# Patient Record
Sex: Female | Born: 1970 | Race: White | Hispanic: No | Marital: Married
Health system: Midwestern US, Academic
[De-identification: ages and names within clinical notes are randomized; demographics above are authoritative.]

## PROBLEM LIST (undated history)

## (undated) DIAGNOSIS — M5417 Radiculopathy, lumbosacral region: ICD-10-CM

---

## 2016-12-18 ENCOUNTER — Encounter: Admit: 2016-12-18 | Discharge: 2016-12-18

## 2016-12-18 DIAGNOSIS — G35 Multiple sclerosis: ICD-10-CM

## 2016-12-18 DIAGNOSIS — E0500 Thyrotoxicosis with diffuse goiter without thyrotoxic crisis or storm: Principal | ICD-10-CM

## 2016-12-18 DIAGNOSIS — R531 Weakness: ICD-10-CM

## 2016-12-18 NOTE — Telephone Encounter
Patient calling inquiring about an MRI she had done recently that showed two bulging discs in her lumbar spine with foot drop, foot numbness and has been referred to a neurosurgeon for a consult. In the last 2-3 days she has began experiencing left foot and leg numbness, with progression to right foot and leg numbness as well as bowel and bladder incontinence.     Baseline MS symptoms include numbness at the bottom of her left foot and left hand.     Pain: Left sided worse, with nerve pain down the center of her left leg with numbness with numbness on the right as well. Both hands are extremely numb as well for the last 2-3 days.     Loss of bowel and bladder issues: baseline urgency issues, no bowel issues. 2/3 three mornings have woken up "soiled" and has no sensation that she has to urinate. onset 2-3 days ago.     Reports having a cavity in her teeth recently and given a prescription for amoxicillin over 1 month ago.     She reports that she may have a urinary tract infection due to change in urine odor.     Not currently on any treatment for MS. Currently enrolled in research trial and received no treatment since 2014.     Instructed patient to present to [facility] ED for evaluation for suspected MS Exacerbation.     Previously on Lemtrada in 2014, no recent doses. Last MRI completed April 2018 without any significant changes. [facility] has most recent Lumbar MRI.     Case discussed with Dr. Burnadette Peter over the phone. Report to have patient present to the ED and be admitted to Neurology with C spine MRI W/WO.

## 2016-12-19 ENCOUNTER — Inpatient Hospital Stay: Admit: 2016-12-19 | Discharge: 2016-12-19

## 2016-12-19 ENCOUNTER — Inpatient Hospital Stay
Admit: 2016-12-19 | Discharge: 2016-12-23 | Disposition: A | Discharge status: Home / Self Care | Payer: Commercial Managed Care - PPO | Attending: Neurology | Admitting: Neurology

## 2016-12-19 ENCOUNTER — Encounter: Admit: 2016-12-19 | Discharge: 2016-12-19

## 2016-12-19 LAB — CBC AND DIFF
Lab: 0.3 10*3/uL — ABNORMAL HIGH (ref 0–0.20)
Lab: 12 10*3/uL — ABNORMAL HIGH (ref 4.5–11.0)
Lab: 0.1 10*3/uL (ref 0–0.20)
Lab: 0.2 10*3/uL (ref 0–0.45)
Lab: 0.5 10*3/uL (ref 0–0.80)
Lab: 1 % (ref 0–2)
Lab: 10 K/UL (ref >60)
Lab: 2 % (ref 0–5)
Lab: 4.6 10*3/uL (ref 1.0–4.8)
Lab: 5 % (ref 4–12)
Lab: 5.1 10*3/uL (ref 1.8–7.0)

## 2016-12-19 LAB — BASIC METABOLIC PANEL
Lab: 0.7 mg/dL (ref 0.4–1.00)
Lab: 109 MMOL/L (ref 98–110)
Lab: 12 mg/dL (ref 7–25)
Lab: 139 MMOL/L — ABNORMAL LOW (ref >60)
Lab: 23 MMOL/L (ref 21–30)
Lab: 3.3 MMOL/L — ABNORMAL LOW (ref 3.5–5.1)
Lab: 7 pg (ref 3–12)
Lab: 8.5 mg/dL (ref 8.5–10.6)
Lab: 95 mg/dL (ref 70–100)
Lab: >60 mL/min (ref >60)
Lab: >60 mL/min — ABNORMAL HIGH (ref >60)

## 2016-12-19 LAB — COMPREHENSIVE METABOLIC PANEL
Lab: 0.3 mg/dL (ref 0.3–1.2)
Lab: 0.7 mg/dL (ref 0.4–1.00)
Lab: 108 MMOL/L (ref 98–110)
Lab: 11 mg/dL (ref 7–25)
Lab: 138 MMOL/L (ref 137–147)
Lab: 14 U/L (ref 7–40)
Lab: 22 MMOL/L (ref 21–30)
Lab: 3.5 MMOL/L (ref 3.5–5.1)
Lab: 4.1 g/dL (ref 3.5–5.0)
Lab: 57 U/L (ref 25–110)
Lab: 8 K/UL — ABNORMAL HIGH (ref 3–12)
Lab: 9 U/L (ref 7–56)
Lab: 9.1 mg/dL (ref 8.5–10.6)
Lab: >60 mL/min (ref >60)
Lab: >60 mL/min (ref >60)

## 2016-12-19 LAB — FREE T4-FREE THYROXINE: Lab: 1.3 ng/dL (ref 0.6–1.6)

## 2016-12-19 LAB — TSH WITH FREE T4 REFLEX: Lab: 0.1 uU/mL — ABNORMAL LOW (ref 0.35–5.00)

## 2016-12-19 LAB — MAGNESIUM: Lab: 2.3 mg/dL (ref 1.6–2.6)

## 2016-12-19 MED ORDER — GABAPENTIN 300 MG PO CAP
600 mg | Freq: Three times a day (TID) | ORAL | 0 refills | Status: DC
Start: 2016-12-19 — End: 2016-12-23
  Administered 2016-12-19 – 2016-12-23 (×14): 600 mg via ORAL

## 2016-12-19 MED ORDER — BACLOFEN 10 MG PO TAB
10 mg | Freq: Three times a day (TID) | ORAL | 0 refills | Status: DC
Start: 2016-12-19 — End: 2016-12-23
  Administered 2016-12-19 – 2016-12-23 (×14): 10 mg via ORAL

## 2016-12-19 MED ORDER — ACETAMINOPHEN 325 MG PO TAB
650 mg | ORAL | 0 refills | Status: DC | PRN
Start: 2016-12-19 — End: 2016-12-19
  Administered 2016-12-19: 08:00:00 650 mg via ORAL

## 2016-12-19 MED ORDER — GADOBENATE DIMEGLUMINE 529 MG/ML (0.1MMOL/0.2ML) IV SOLN
15 mL | Freq: Once | INTRAVENOUS | 0 refills | Status: CP
Start: 2016-12-19 — End: ?
  Administered 2016-12-19: 13:00:00 15 mL via INTRAVENOUS

## 2016-12-19 MED ORDER — HYDROCODONE-ACETAMINOPHEN 5-325 MG PO TAB
1 | Freq: Two times a day (BID) | ORAL | 0 refills | Status: DC
Start: 2016-12-19 — End: 2016-12-21
  Administered 2016-12-19 – 2016-12-21 (×5): 1 via ORAL

## 2016-12-19 MED ORDER — TRAZODONE 50 MG PO TAB
50 mg | Freq: Every evening | ORAL | 0 refills | Status: DC | PRN
Start: 2016-12-19 — End: 2016-12-23
  Administered 2016-12-20 – 2016-12-23 (×4): 50 mg via ORAL

## 2016-12-19 MED ORDER — GABAPENTIN 300 MG PO CAP
600 mg | Freq: Once | ORAL | 0 refills | Status: CP
Start: 2016-12-19 — End: ?
  Administered 2016-12-19: 12:00:00 600 mg via ORAL

## 2016-12-19 MED ORDER — LEVOTHYROXINE 125 MCG PO TAB
125 ug | Freq: Every day | ORAL | 0 refills | Status: DC
Start: 2016-12-19 — End: 2016-12-23
  Administered 2016-12-19 – 2016-12-23 (×5): 125 ug via ORAL

## 2016-12-19 MED ORDER — TOPIRAMATE 25 MG PO TAB
100 mg | Freq: Every evening | ORAL | 0 refills | Status: DC
Start: 2016-12-19 — End: 2016-12-23
  Administered 2016-12-20 – 2016-12-23 (×4): 100 mg via ORAL

## 2016-12-19 MED ORDER — ENOXAPARIN 40 MG/0.4 ML SC SYRG
40 mg | Freq: Every day | SUBCUTANEOUS | 0 refills | Status: DC
Start: 2016-12-19 — End: 2016-12-23
  Administered 2016-12-20 – 2016-12-23 (×4): 40 mg via SUBCUTANEOUS

## 2016-12-19 MED ORDER — HYDROCODONE-ACETAMINOPHEN 5-325 MG PO TAB
1 | Freq: Once | ORAL | 0 refills | Status: CP
Start: 2016-12-19 — End: ?
  Administered 2016-12-19: 12:00:00 1 via ORAL

## 2016-12-19 MED ORDER — NYSTATIN 100,000 UNIT/ML PO SUSP
500000 [IU] | Freq: Four times a day (QID) | ORAL | 0 refills | Status: DC
Start: 2016-12-19 — End: 2016-12-19

## 2016-12-19 MED ORDER — ALBUTEROL SULFATE 90 MCG/ACTUATION IN HFAA
2 | RESPIRATORY_TRACT | 0 refills | Status: DC | PRN
Start: 2016-12-19 — End: 2016-12-23

## 2016-12-19 MED ORDER — ACETAMINOPHEN 325 MG PO TAB
650 mg | ORAL | 0 refills | Status: DC | PRN
Start: 2016-12-19 — End: 2016-12-23
  Administered 2016-12-20 – 2016-12-23 (×5): 650 mg via ORAL

## 2016-12-19 MED ORDER — ENOXAPARIN 40 MG/0.4 ML SC SYRG
40 mg | Freq: Every day | SUBCUTANEOUS | 0 refills | Status: CN
Start: 2016-12-19 — End: ?

## 2016-12-19 NOTE — Case Management (ED)
Case Management Admission Assessment    NAME:Pamela Kirk                          MRN: [id number]             DOB:[birth year]          AGE: 46 y.o.  ADMISSION DATE: 12/19/2016             DAYS ADMITTED: LOS: 0 days      Today???s Date: 12/19/2016    Source of Information: pt and EMR review       Plan  Plan: CM Assessment, Assist PRN with SW/NCM Services, Discharge Planning for Home Anticipated   Pt plans to dc to home with family assistance. Pt denies the need for HH/DME/Inpt setting upon dc. Family able to provide 24/7 support upon dc.  No additional dc needs identified.  Will continue to monitor.    Patient Address/Phone  [address]  [place] [address]  [phone] (home)     Emergency Contact  Extended Emergency Contact Information  Primary Emergency Contact: Lilliane, Balay [place]  Home Phone: [phone]  Mobile Phone: [phone]  Relation: Relative  Secondary Emergency Contact: [facility]  Address: [address] [place], [place] [address] [place]  Home Phone: [phone]  Mobile Phone: [phone]  Relation: Spouse  Interpreter needed? No    Healthcare Directive         Transportation  Does the patient need discharge transport arranged?: No  Transportation Name, Phone and Availability #1: husband Riki Rusk  Does the patient use Medicaid Transportation?: No    Expected Discharge Date  Expected Discharge Date: 12/22/16  Discharge Planning Comments: home with family assistance    Living Situation Prior to Admission  ? Living Arrangements  Type of Residence: Home, independent  Living Arrangements: Spouse/significant other, Family members (husband Riki Rusk and 3 adult children (ages 42, 35, 56yrs))  Financial risk analyst / Tub: Tub/Shower Unit  How many levels in the residence?: 1  Can patient live on one level if needed?: Yes  Does residence have entry and/or side stairs?: Yes (2 steps to enter)  Assistance needed prior to admit or anticipated on discharge: No Who provides assistance or could if needed?: husband or children  Are they in good health?: Yes  Can support system provide 24/7 care if needed?: Yes  ? Level of Function   Prior level of function: Independent  ? Cognitive Abilities   Cognitive Abilities: Alert and Oriented, Engages in problem solving and planning, Participates in decision making, Recognizes impact of health condition on lifestyle    Financial Resources  ? Coverage  Primary Insurance: Theatre manager Coverage: RX    ? Source of Income   Source Of Income: Employed  ? Financial Assistance Needed?  n/a    Psychosocial Needs  ? Mental Health  Mental Health History: No  ? Substance Use History  Substance Use History Screen: No  ? Other  n/a    Current/Previous Services  ? PCP  Sidney Ace, [phone], [phone]  ? Pharmacy    [facility] [address] [place] - [address]  [address]  [place] [address]  Phone: [phone] Fax: [phone]    [facility] [phone] - [place], [place] - [address]  [address]  [place] [address]  Phone: [phone] Fax: [phone]    ? Durable Psychologist, educational at home: [employer], [employer] Chair, Single [employer]  ? Home Health  Receiving home health: No  ? Hemodialysis or Peritoneal Dialysis  Undergoing hemodialysis or peritoneal dialysis: No  ? Tube/Enteral Feeds  Receive tube/enteral feeds: No  ? Infusion  Receive infusions: No  ? Private Duty  Private duty help used: No  ? Home and Community Based Services  Home and community based services: No  ? Ryan White  Ryan White: No  ? Hospice  Hospice: No  ? Outpatient Therapy  PT: No  OT: No  SLP: No  ? Skilled Nursing Facility/Nursing Home  SNF: No  NH: No  ? Inpatient Rehab  IPR: No  ? Long-Term Acute Care Hospital  LTACH: No  ? Acute Hospital Stay  Acute Hospital Stay: No    Sela Hilding, BSN, RN  Department of Case Management  Pgr: [phone] ph: [phone]

## 2016-12-19 NOTE — ED Notes
[id number]: [id number]. Call Marcela, RN for report at [phone] please.

## 2016-12-19 NOTE — ED Notes
Pt has a hx of M.S.  Fell 3 weeks ago after a seizure , had a concussion at that time and started having back pain, the back pain has increased and pt has been incontinent of bowel and bladder. Pt placed on the monitor upon arrival,pt awake alert orient, ambulated to room 39, side rails up , bed locked in low position, call light in reach, husband at bedside, will continue to monitor

## 2016-12-19 NOTE — ED Notes
Report to Shelby RN

## 2016-12-19 NOTE — ED Notes
Clothing: shirt sweat pants bra underware tennis shoes,   Shoes:  Tennis shoes  Jewelry: none  Identification/Drivers license: none  Cash: none  Credit cards: none  Electronics:  Magazine features editor aids: partial denture  Assistive devices: none  Other:  Purse and 2 bags        Belongings disposition:  with patient at bedside

## 2016-12-19 NOTE — ED Notes
Waiting for neuro  For consult

## 2016-12-19 NOTE — Progress Notes
RESPIRATORY THERAPY  ADULT PROTOCOL EVALUATION      RESPIRATORY PROTOCOL PLAN    Medications  Albuterol: MDI PRN (home reg.)    Note: If indicated by protocol, medication orders will be placed by therapist.    Procedures          _____________________________________________________________    PATIENT EVALUATION RESULTS    Chart Review  * Pulmonary Hx: Smoking cessation < 8 weeks OR still smoking OR > 20 pack/yr hx (PEFR)_____ OR occasional use of bronchodilator (AM) (current smoker, uses albuterol as needed)    * Surgical Hx: No surgery OR last surgery > 6 weeks ago OR trach/stoma (BA)    * Chest X-Ray: Clear OR not available    * PFT/Oxygenation: FEV1, PEFR > 80% predicted OR physically unable to perform OR Pa02 >80 RA OR Sp02 >95% RA      Patient Assessment  * Respiratory Pattern: Regular pattern and rate OR good chest excursion with deep breathing    * Breath Sounds: Clear and able to auscultate bases posteriorly    * Cough / Sputum: Strong, effective cough OR nonproductive    * Mental Status: Alert, oriented, cooperative    * Activity Level: Ambulatory      Priority Index  Total Points: 2 Points  * Priority Index: 1    PRIORITY INDEX GUIDELINES*  Priority Points   1 0-9 points   2 9-18 points   3 > 18 points   + Pulm Dx or Home Rx   *Higher points indicate higher acuity.      Therapist: Loistine Chance, RT  Date: 12/19/2016      Key  [facility] = Airway clearance  AM = Aerosolized medication  BA = Bland aerosol  DB&C = Deep breathe & cough  FEV1 = Forced expiratory volume in first second)  IC = Inspiratory capacity  LE = Lung expansion  MDI = Metered dose inhaler  Neb = Nebulizer  O2 = Oxygen  Oxim =Oximetry  PEFR = Peak expiratory flow rate  RRT = Rapid Response Team

## 2016-12-19 NOTE — ED Notes
Pt updated on plan of care and resting comfortably in bed @ this time.

## 2016-12-19 NOTE — ED Notes
Report called to [facility]

## 2016-12-20 DIAGNOSIS — G35 Multiple sclerosis: Secondary | ICD-10-CM

## 2016-12-20 MED ORDER — AMITRIPTYLINE(#)/GABAPENTIN/EMU OIL 4/4/10%IN HRT TOPICAL CRM 50G
TOPICAL | 0 refills | Status: DC
Start: 2016-12-20 — End: 2016-12-23
  Administered 2016-12-21 – 2016-12-23 (×2): via TOPICAL

## 2016-12-20 MED ORDER — GADOBENATE DIMEGLUMINE 529 MG/ML (0.1MMOL/0.2ML) IV SOLN
15 mL | Freq: Once | INTRAVENOUS | 0 refills | Status: CP
Start: 2016-12-20 — End: ?
  Administered 2016-12-20: 18:00:00 15 mL via INTRAVENOUS

## 2016-12-21 LAB — VITAMIN B12: Lab: 676 pg/mL (ref 180–914)

## 2016-12-21 MED ORDER — HYDROCODONE-ACETAMINOPHEN 5-325 MG PO TAB
1.5 | Freq: Two times a day (BID) | ORAL | 0 refills | Status: DC
Start: 2016-12-21 — End: 2016-12-23
  Administered 2016-12-22 – 2016-12-23 (×4): 1.5 via ORAL

## 2016-12-21 NOTE — Consults
[name] Consult Note      Consult Date: 12/19/2016                                                  Chief Complaint/Reason for Consult:  Left leg pain/disc herniation on MRI    Assessment/Plan   Orthopedic Intervention - No acute orthopedic intervention  WB Status - WBAT BLE  ROM Status - ROMAT  Antibiotics / Tetanus - per primary, none necessary from ortho standpoint  Pain Control per primary  Diet per primary  PT/OT  DVT PPX - Mechanical, Chemoprophylaxis per primary  Dispo - would recommend anesthesia chronic pain management consult for epidural steroid injection as an inpatient.  Orthopedics will continue to follow patient after that as an outpatient.  Would like to see her in 3-4 weeks with Dr. Jean Rosenthal.  These have the patient call [phone] to schedule an appointment.    Please page Hazle Nordmann during normal business hours with any other questions or concerns.  ______________________________________________________________________    History of Present Illness: Pamela Kirk is a 46 y.o. female with a history of multiple sclerosis who was admitted to the hospital with concern for MS exacerbation.  States she has been having radicular leg pain in her left leg for the last 3-4 weeks.  Of note, immediately prior that she reports a fall while on a float trip where she landed on her back.  The pain in her leg is been getting worse over last 3 weeks.  States it begins in her buttock and goes down the posterior aspect of her leg and generally ends at her knee.  She does have some right buttock and thigh pain as well.  She also has some numbness on the dorsal aspect of her left foot.  No fevers or chills.  She has had some nighttime incontinence for a couple of weeks, but she is able to tell when she needs to go the bathroom during the day.    Past Medical History:   Diagnosis Date   ??? Graves disease    ??? Multiple sclerosis (HCC)        Past Surgical History:   Procedure Laterality Date ??? TUBAL LIGATION  2001   ??? THYROIDECTOMY  11/2012       Social History  Social History     Social History   ??? Marital status: Married     Spouse name: N/A   ??? Number of children: N/A   ??? Years of education: N/A     Occupational History   ??? Not on file.     Social History Main Topics   ??? Smoking status: Current Every Day Smoker     Packs/day: 1.00     Types: Cigarettes   ??? Smokeless tobacco: Not on file   ??? Alcohol use Yes      Comment: occ   ??? Drug use: No   ??? Sexual activity: Not on file     Other Topics Concern   ??? Not on file     Social History Narrative   ??? No narrative on file         Family history  Family history reviewed with patient, no pertinent past family history  Family history is otherwise noncontributory for presenting problem    Allergies:  Codeine; Iodine; Other [unclassified drug]; and  Latex, natural rubber    Outpatient Prescriptions as of 12/21/2016   Medication Sig Dispense Refill   ??? acetaminophen (TYLENOL) 325 mg tablet Take 2 Tabs by mouth every 6 hours as needed for Pain. (Patient taking differently: Take 650 mg by mouth every 12 hours as needed for Pain.)     ??? ALPRAZolam(+) (XANAX) 2 mg tablet Take 2 mg by mouth at bedtime daily.     ??? baclofen (LIORESAL) 10 mg tablet TAKE ONE TABLET BY MOUTH TWICE DAILY FOR MUSCLE SPASM OF  SPINAL  ORIGEN 180 tablet 3   ??? dextroamphetamine-amphetamine(+) (ADDERALL) 30 mg tablet Take 30 mg by mouth twice daily     ??? diclofenac submicronized (ZORVOLEX) 35 mg cap Take 1 capsule by mouth three times daily.     ??? gabapentin (NEURONTIN) 600 mg tablet 1 Tab four times daily. (Patient taking differently: 600 mg three times daily.) 360 Tab 1   ??? HYDROcodone/acetaminophen (NORCO) 5/325 mg tablet Take 1 tablet by mouth twice daily     ??? levothyroxine (SYNTHROID) 125 mcg tablet Take 125 mcg by mouth daily 30 minutes before breakfast.     ??? losartan(+) (COZAAR) 100 mg tablet Take 100 mg by mouth daily. ??? metoprolol XL (TOPROL XL) 25 mg extended release tablet Take 25 mg by mouth daily.     ??? prednisone (DELTASONE) 20 mg tablet Take 3 tablets by mouth daily for three days, then 2 tablets for 3 days, then 1 tablet for three days, then a 1/2 tablet for 3 days, then stop     ??? rizatriptan (MAXALT) 5 mg tablet 1 Tab as Needed. May repeat in 2 hours in needed  Indications: MIGRAINE 12 Tab 5   ??? sertraline (ZOLOFT) 100 mg tablet Take 100 mg by mouth daily.     ??? tiZANidine (ZANAFLEX) 4 mg tablet Take 4 mg by mouth three times daily as needed.     ??? topiramate (TOPAMAX) 100 mg tablet Take 100 mg by mouth daily.     ??? traZODone (DESYREL) 50 mg tablet Take 1 Tab by mouth at bedtime as needed. 90 Tab 1         Review of Systems:  Pertinent positive findings: Radicular leg pain, numbness  Pertinent negative findings: Weakness, fevers, chills, bowel or bladder incontinence  Otherwise a 10 point review of systems was negative    Vital Signs:  Last Filed in 24 hours   BP: 147/84 (08/26 1435)  Temp: 36.8 ???C (98.2 ???F) (08/26 1435)  Pulse: 60 (08/26 1435)  Respirations: 18 PER MINUTE (08/26 1435)  SpO2: 96 % (08/26 1435)  O2 Delivery: None (Room Air) (08/26 1435)     Physical Exam:    Constitutional: AAOx3, NAD  Eyes: PERRL  ENT: Oropharynx/Nasopharynx clear  Respiratory: Unlabored respirations  Cardiovascular: Regular rate  Gastrointestinal: Soft  Skin: No significant ecchymosis or erythema over her back  Vascular: Palpable DP/PT pulses bilaterally  Musculoskeletal: Active and passive range of motion of all joints is maintained.  No joints are tender to palpation.  Neurologic:   Strength  Upper Ext. Deltoid Triceps Biceps Wrist Ext Wrist Flex Grip Interossei   Right 5 5 5 5 5 5 5    Left 5 5 5 5 5 5 5    Negative Hoffman test    Lower Ext. Hip Flex Quads Hamstrings Plantarflex Dorsiflex EHL   Right 5 5 5 5 5 5    Left 5 5 5 5 5 5    Downgoing Babinki Bilaterally  Negative  Clonus Bilaterally    Reflexes: Triceps Biceps Brachiorad Patellar Achilles   Right 2+ 2+ 2+ 2+ 2+   Left 2+ 2+ 2+ 2+ 2+       Lab/Radiology/Other Diagnostic Tests:    Radiology: MRI of her lumbar spine is reviewed.  This demonstrates a relatively large paracentral disc herniation at L5-S1 with significant compression of the S1 nerve root on the left    CBC w/Diff   Lab Results   Component Value Date/Time    WBC 10.4 12/19/2016 11:32 AM    HGB 12.3 12/19/2016 11:32 AM    HCT 37.5 12/19/2016 11:32 AM    PLTCT 273 12/19/2016 11:32 AM           Basic Metabolic Profile   Lab Results   Component Value Date/Time    NA 139 12/19/2016 11:32 AM    K 3.3 (L) 12/19/2016 11:32 AM    CL 109 12/19/2016 11:32 AM    CO2 23 12/19/2016 11:32 AM    GAP 7 12/19/2016 11:32 AM    BUN 12 12/19/2016 11:32 AM    CR 0.78 12/19/2016 11:32 AM    GLU 95 12/19/2016 11:32 AM        Coagulation Studies   No results found for: PT, PTT, INR         Frances Maywood, MD

## 2016-12-22 LAB — HEMOGLOBIN A1C: Lab: 5.3 % (ref 4.0–6.0)

## 2016-12-22 NOTE — Progress Notes
RESPIRATORY THERAPY  ADULT PROTOCOL EVALUATION      RESPIRATORY PROTOCOL PLAN    Medications  Albuterol: MDI PRN    Note: If indicated by protocol, medication orders will be placed by therapist.    Procedures          _____________________________________________________________    PATIENT EVALUATION RESULTS    Chart Review  * Pulmonary Hx: Smoking cessation < 8 weeks OR still smoking OR > 20 pack/yr hx (PEFR)_____ OR occasional use of bronchodilator (AM)    * Surgical Hx: No surgery OR last surgery > 6 weeks ago OR trach/stoma (BA)    * Chest X-Ray: Clear OR not available    * PFT/Oxygenation: FEV1, PEFR > 80% predicted OR physically unable to perform OR Pa02 >80 RA OR Sp02 >95% RA      Patient Assessment  * Respiratory Pattern: Regular pattern and rate OR good chest excursion with deep breathing    * Breath Sounds: Clear and able to auscultate bases posteriorly    * Cough / Sputum: Strong, effective cough OR nonproductive    * Mental Status: Alert, oriented, cooperative    * Activity Level: Ambulatory with assistance      Priority Index  Total Points: 3 Points  * Priority Index: 1+    PRIORITY INDEX GUIDELINES*  Priority Points   1 0-9 points   2 9-18 points   3 > 18 points   + Pulm Dx or Home Rx   *Higher points indicate higher acuity.      Therapist: Priscille Loveless, RT  Date: 12/22/2016      Key  AC = Airway clearance  AM = Aerosolized medication  BA = Bland aerosol  DB&C = Deep breathe & cough  FEV1 = Forced expiratory volume in first second)  IC = Inspiratory capacity  LE = Lung expansion  MDI = Metered dose inhaler  Neb = Nebulizer  O2 = Oxygen  Oxim =Oximetry  PEFR = Peak expiratory flow rate  RRT = Rapid Response Team

## 2016-12-22 NOTE — Progress Notes
PHYSICAL THERAPY  ASSESSMENT/ DISCHARGE     MOBILITY:  Mobility  Progressive Mobility Level: Walk in hallway  Distance Walked (feet): 150 ft  Level of Assistance: Stand by assistance  Assistive Device: None  Time Tolerated: 11-30 minutes  Activity Limited By: Pain;Weakness    SUBJECTIVE:  Subjective  Significant hospital events: history of multiple sclerosis who was admitted to the hospital with concern for MS exacerbation. 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 and compression of L S1 nerve  Mental / Cognitive Status: Alert;Oriented;Cooperative  Persons Present: Spouse;Occupational Therapist  Pain: Patient complains of pain;Patient does not rate pain  Pain Location: Back  Pain Description: Aching  Pain Interventions: Patient agrees to participate in therapy  Precautions: Back Safety  Ambulation Assist: Independent Mobility in Community without Device  Patient Owned Equipment: Single Point [employer]  Home Situation: Lives with Family  Type of Home: House  Entry Stairs: No Rail;1-2 Stairs  In-Home Stairs: No Stairs    ROM:  ROM  UE ROM WFL: Yes  LE ROM WFL: Yes    STRENGTH:  Strength  Overall Strength: WFL  Strength Comment: pain with MMT to LLE    POSTURE/NEURO:  Posture / Neurological  LLE Sensation/Proprioception: Impaired Light Touch  RLE Sensation/Proprioception: Intact Light Touch  Coordination: No Deficits Noted    BED MOBILITY/TRANSFERS:  Bed Mobility/Transfers  Bed Mobility: Rolling: Independent  Bed Mobility: Supine to Sit: Modified Independent  Bed Mobility: Sit to Supine: Modified Independent  Comments: demonstrates ind log roll tech  Transfer Type: Sit to/from Stand  Transfer: Assistance Level: To/From;Bed;Modified Independent  Transfer: Assistive Device: None  End Of Activity Status: In Bed;Instructed Patient to Request Assist with Mobility;Instructed Patient to Use Call Light    BALANCE:  Balance Sitting Balance: Static Sitting Balance;Dynamic Sitting Balance;No UE Support;Independent  Standing Balance: Static Standing Balance;Dynamic Standing Balance;No UE support;Standby Assist  Comments: patient ed to take advantage of cane and 4wh rolling walker at home for balance and pain relief in acute stages    GAIT:  Gait  Gait Distance: 150 feet  Gait: Assistance Level: Standby Assist  Gait: Assistive Device: None  Gait: Descriptors: Antalgic;Decreased stance time LLE;Pace: Slow;No balance loss;Decreased step length  Activity Limited By: Complaint of Pain  Comments: ed verbally on step to pattern to navigate steps of her home, verbalized understanding    EDUCATION:  Education  Persons Educated: Patient/Family  Patient Barriers To Learning: None Noted  Interventions: Repetition of Instructions;Family Education  Teaching Methods: Verbal Instruction  Patient Response: Verbalized Understanding  Topics: Plan/Goals of PT Interventions;Use of Assistive Device/Orthosis;Mobility Progression;Importance of Increasing Activity;Ambulate with Family;Recommend Continued Therapy    ASSESSMENT/PROGRESS:  Assessment/Progress  Impaired Mobility Due To: Pain  Impaired Strength Due To: Pain  Assessment/Progress: Should Improve w/ Continued PT  AM-PAC 6 Clicks Basic Mobility Inpatient  Turning from your back to your side while in a flat bed without using bed rails: None  Moving from lying on your back to sitting on the side of a flatbed without using bedrails : None  Moving to and from a bed to a chair (including a wheelchair): None  Standing up from a chair using your arms (e.g. wheelchair, or bedside chair): None  To walk in hospital room: None  Climbing 3-5 steps with a railing: A Little  Raw Score: 23  Standardized (T-scale) Score: 50.88  Basic Mobility CMS 0-100%: 16.55  CMS G Code Modifier for Basic Mobility: CI  GOALS:  Goals  Goal Formulation: With Patient    PLAN:  Plan Treatment Interventions: Mobility Training;Strengthening;ROM;Neuromuscular Reeducation;Balance Activities  Plan Frequency:  (rec outpatient therapy)    RECOMMENDATIONS:  PT Discharge Recommendations  PT Discharge Recommendations: Home with Assistance;Outpatient Therapy Setting  Equipment Recommendations: Patient owns necessary equipment  Comments: patient safe to d/c home with family and outpatient therapy    Therapist: Dalphine Handing, PT, DPT, [facility]  Date: 12/22/2016

## 2016-12-22 NOTE — Progress Notes
I have reviewed the notes, assessment, and/or procedures performed by Michelle O. and concur with her documentation unless otherwise noted.

## 2016-12-22 NOTE — Consults
Chronic Pain Consult Note      Admission Date: 12/19/2016                                                LOS: 3 days    Reason for Consult:  Pain intervention/injection    Consult type: Opinion with orders    Assessment/Plan   Matasha L Blankinship is a 46 y.o. female with h/o multiple sclerosis admitted with leg pain in radicular pattern  - MRI L-spine with moderate left subarticular disc herniation at L5-S1 with severe left lateral recess stenosis and compression of the descending left S1 nerve root  - Radicular symptoms consistent with imaging above  - Will plan for bilateral L5-S1 transforaminal epidural steroid injections tomorrow  - Prophylactic lovenox will need to be held for 12 hours but she will not need to be NPO    Joelyn Oms, MD  Pager [phone] 8a-4p M-F ([phone] for urgent issues after hours and on weekends)  ______________________________________________________________________    History of Present Illness: Ellorie L Mcgrue is a 46 y.o. female is admitted with leg pain.  She localizes the pain to her left leg. She says it started about 3-4 weeks ago when she had a fall while she was on a float trip where she landed on her back.  The leg pain is gotten progressively worse since that time. The pain radiates from her back to her buttock and down the posterior aspect of her leg to the level of her knee. She has some numbness in the top of her left foot. She has some right leg pain but worse on the left.     Past Medical History:   Diagnosis Date   ??? Graves disease    ??? Multiple sclerosis (HCC)      Past Surgical History:   Procedure Laterality Date   ??? TUBAL LIGATION  2001   ??? THYROIDECTOMY  11/2012     Social History     Social History   ??? Marital status: Married     Spouse name: N/A   ??? Number of children: N/A   ??? Years of education: N/A     Social History Main Topics   ??? Smoking status: Current Every Day Smoker     Packs/day: 1.00     Types: Cigarettes   ??? Smokeless tobacco: Not on file   ??? Alcohol use Yes Comment: occ   ??? Drug use: No   ??? Sexual activity: Not on file     Other Topics Concern   ??? Not on file     Social History Narrative   ??? No narrative on file     No family history on file.  Allergies:  Codeine; Iodine; Other [unclassified drug]; and Latex, natural rubber    Scheduled Meds:  amitriptyline/gabapentin/emu oil(#) 07/30/08 % topical cream  Topical Q8H   baclofen (LIORESAL) tablet 10 mg 10 mg Oral TID   enoxaparin (LOVENOX) syringe 40 mg 40 mg Subcutaneous QDAY(21)   gabapentin (NEURONTIN) capsule 600 mg 600 mg Oral TID   HYDROcodone/acetaminophen (NORCO) 5/325 mg tablet 1.5 tablet 1.5 tablet Oral BID   levothyroxine (SYNTHROID) tablet 125 mcg 125 mcg Oral QDAY   topiramate (TOPAMAX) tablet 100 mg 100 mg Oral QHS   Continuous Infusions:  PRN and Respiratory Meds:acetaminophen Q6H PRN, albuterol Q4H PRN, traZODone QHS PRN  Review of Systems:  A 14 point review of systems was negative except for: back and leg pain  Vital Signs:  Last Filed in 24 hours Vital Signs:  24 hour Range    BP: 147/93 (08/27 1400)  Temp: 36.8 ???C (98.3 ???F) (08/27 1400)  Pulse: 62 (08/27 1400)  Respirations: 17 PER MINUTE (08/27 1400)  SpO2: 99 % (08/27 1400)  O2 Delivery: None (Room Air) (08/27 1400) BP: (145-156)/(75-93)   Temp:  [36.6 ???C (97.8 ???F)-37.2 ???C (98.9 ???F)]   Pulse:  [59-68]   Respirations:  [16 PER MINUTE-18 PER MINUTE]   SpO2:  [95 %-99 %]   O2 Delivery: None (Room Air)     Physical Exam:  General: Alert, cooperative, no distress  Head: Normocephalic, atraumatic  Eyes: Conjunctivae/corneas clear  Lungs: Unlabored respirations  Heart: Normal rate by palpation of pulse  Abdomen: Non-distended  Skin: Warm and dry to touch  Psychiatric: Mood and affect normal  Musculoskeletal: Mod lumbar tenderness. Increased pain with lumbar extension.  Neurologic: Strength 4+/5 LLE, 5/5 RLE. Sensation to light touch is decreased LLE. Straight leg raise is positive on the left.      Lab/Radiology/Other Diagnostic Tests: 24-hour labs:  No results found for this visit on 12/19/16 (from the past 24 hour(s)).  Pertinent radiology reviewed.    Joelyn Oms, MD  Pager [phone]

## 2016-12-22 NOTE — Progress Notes
Notified by team that pt doesn't currently need IV as long as no further scans are needed. Team consulted anesthesia/pain for possibility of steroid shots instead. RN acknowledged and will continue to monitor

## 2016-12-22 NOTE — Progress Notes
OCCUPATIONAL THERAPY  ASSESSMENT NOTE    Patient Name: Pamela Kirk                   Room/Bed: ZO1096/04  Admitting Diagnosis: MS (multiple sclerosis) (HCC)  Past Medical History:   Diagnosis Date   ??? Graves disease    ??? Multiple sclerosis (HCC)        Mobility  Progressive Mobility Level: Walk in hallway  Distance Walked (feet): 200 ft  Level of Assistance: Stand by assistance  Assistive Device: None  Time Tolerated: 11-30 minutes  Activity Limited By: Pain    Subjective  Pertinent Dx per Physician: history of multiple sclerosis who was admitted to the hospital with concern for MS exacerbation. 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 and compression of L S1 nerve  Precautions: Standard  Pain Location: Back  Pain Level Current: 5  Comments: Pt with c/o headache and back pain. Pt states she does not have any concerns regarding discharge home.    Objective  Psychosocial Status: Willing and Cooperative to Participate  Persons Present: Spouse;Physical Therapist    Home Living  Type of Home: House  Home Layout: One Level;Stairs to [employer] w/o [employer] (2 steps to enter)  Financial risk analyst / Tub: Tub/Shower Unit  [employer] Toilet: Standard  Bathroom Equipment: Buyer, retail: Administrator, arts  Comment: Spouse reports he plans to purchase a toilet riser    Prior Function  Level Of Independence: Independent with ADLs and functional transfers;Independent with homemaking w/ ambulation  Lives With: Spouse  Receives Help From: None Needed  Homemaking Tasks: Meal Prep;Laundry;Cleaning  Other Function Comments: Pt reports she is independent. Owns equipment however does not use it regularly. Pt denies concerns regarding discharge home.    Vision  Current Vision: No Visual Deficits;Does Not Wear Glasses  Comment: Pt denies vision changes    ADL's  Where Assessed: In Bathroom;Standing at [employer]: Independent Grooming Deficits: No Assist Needed;Wash/Dry Hands  LE Dressing Assist: Stand By Assist  LE Dressing Deficits: Don/Doff R Sock;Don/Doff L Sock  Toileting Assist: Stand By Assist  Toileting Deficits: Supervision/Safety  Functional Transfer Assist: Stand By Assist  Functional Transfer Deficits: Supervision/Safety  Comment: Pt reports no concerns regarding discharge home. Pt transfers and ambulates with stand by assistance.    Activity Tolerance  Endurance: 3/5 Tolerates 25-30 Minutes Exercise w/Multiple Rests  Sitting Balance: 5/5 Moves/Returns Trunkal Midpoint in All Planes > 2 Inches    Cognition  Overall Cognitive Status: WFL to Adequately Complete Self Care Tasks Safely  Orientation: Alert & Oriented x4  Attention: Awake/Alert    UE PROM  Overall BUE PROM WNL: Yes    UE AROM  Overall BUE AROM WNL: Yes  Coordination: Adequate to Complete ADLs  Grasp: Bilateral Grasp Functional for Activity    UE Strength / Tone  Overall Strength / Tone: WFL Able to Perform ADL Tasks    Education  Persons Educated: Patient  Barriers To Learning: None Noted  Teaching Methods: Verbal Instruction  Patient Response: Verbalized Understanding  Topics: Role of OT, Goals for Therapy;Home safety  Goal Formulation: With Patient    Assessment  Assessment: Decreased ADL Status;Decreased Self-Care Trans;Decreased High-Level ADLs;Decreased Endurance  Prognosis: Good;Ongoing OT Assessment Needed  Goal Formulation: Patient  No Skilled OT: Safe To Return Home    AM-PAC 6 Clicks Daily Activity Inpatient  Putting on and taking off regular lower body clothes?: None  Bathing (  Including washing, rinsing, drying): None  Toileting, which includes using toilet, bedpan, or urinal: None  Putting on and taking off regular upper body clothing: None  Taking care of personal grooming such as brushing teeth: None  Eating meals?: None  Daily Activity Raw Score: 24  Standardized (t-scale) score: 57.54  CMS 0-100% Score: 0  CMS G Code Modifier: CH    Plan Progress: Discontinue OT  OT Frequency: No Further Treatment    OT Discharge Recommendations  OT Discharge Recommendations: Home with family assist, Home setting w/Outpatient Therapy  Equipment Recommendations: Patient owns necessary equipment      Therapist: Roxy Horseman, OTR/L [id number]  Date: 12/22/2016

## 2016-12-23 ENCOUNTER — Inpatient Hospital Stay: Admit: 2016-12-20 | Discharge: 2016-12-20

## 2016-12-23 ENCOUNTER — Inpatient Hospital Stay: Admit: 2016-12-23 | Discharge: 2016-12-23

## 2016-12-23 DIAGNOSIS — M48061 Spinal stenosis, lumbar region without neurogenic claudication: ICD-10-CM

## 2016-12-23 DIAGNOSIS — M5117 Intervertebral disc disorders with radiculopathy, lumbosacral region: Principal | ICD-10-CM

## 2016-12-23 DIAGNOSIS — G43909 Migraine, unspecified, not intractable, without status migrainosus: ICD-10-CM

## 2016-12-23 DIAGNOSIS — I10 Essential (primary) hypertension: ICD-10-CM

## 2016-12-23 DIAGNOSIS — F17210 Nicotine dependence, cigarettes, uncomplicated: ICD-10-CM

## 2016-12-23 DIAGNOSIS — F329 Major depressive disorder, single episode, unspecified: ICD-10-CM

## 2016-12-23 DIAGNOSIS — R32 Unspecified urinary incontinence: ICD-10-CM

## 2016-12-23 DIAGNOSIS — G35 Multiple sclerosis: ICD-10-CM

## 2016-12-23 DIAGNOSIS — E890 Postprocedural hypothyroidism: ICD-10-CM

## 2016-12-23 DIAGNOSIS — F419 Anxiety disorder, unspecified: ICD-10-CM

## 2016-12-23 MED ORDER — ACETAMINOPHEN 325 MG PO TAB
650 mg | Freq: Two times a day (BID) | ORAL | 0 refills | Status: SS | PRN
Start: 2016-12-23 — End: 2017-05-06

## 2016-12-23 MED ORDER — GABAPENTIN 600 MG PO TAB
600 mg | Freq: Three times a day (TID) | ORAL | 0 refills | Status: AC
Start: 2016-12-23 — End: 2017-04-23

## 2016-12-23 MED ORDER — AMITRIPTYLINE(#)/GABAPENTIN/EMU OIL 4/4/10%IN HRT TOPICAL CRM 50G
Freq: Two times a day (BID) | 1 refills | 30.00000 days | Status: AC | PRN
Start: 2016-12-23 — End: 2017-04-23

## 2016-12-23 MED ORDER — METHYLPREDNISOLONE ACETATE 80 MG/ML IJ SUSP
80 mg | Freq: Once | INTRAMUSCULAR | 0 refills | Status: DC
Start: 2016-12-23 — End: 2016-12-23
  Administered 2016-12-23: 17:00:00 80 mg via INTRAMUSCULAR

## 2016-12-23 MED ORDER — AMITRIPTYLINE(#)/GABAPENTIN/EMU OIL 4/4/10%IN HRT TOPICAL CRM 50G
1 refills | 30.00000 days | Status: AC
Start: 2016-12-23 — End: 2017-04-23

## 2016-12-23 NOTE — Progress Notes
Neurology Progress Note  Pamela Kirk  Admission Date: 12/19/2016  LOS: 4 days     Assessment/Plan:  Active Problems:    MS (multiple sclerosis) (HCC)    Pamela Kirk is a 46 y/o female with history of MS, irregular heart rate, HTN, Grave's disease s/p resection, anxiety, depression, migraines, neuropathy that presented with left sided weakness, numbness/tingling over last 3 weeks and new onset incontinence over last 3 days concerning for MS exacerbation.     Left Sided Weakness, numbness/tingling  Incontinence  -MRI L spine 8/24: L subarticular disc herniation at L5-S1 with severe L lateral recess stenosis and compression of L S1 nerve.  Non-enhancing T2 hyperintense lesion of T11-T12 consistent with prior MS plaque. Mild central spinal stenosis at L4-L5 and neural foraminal stenosis at L4-L5 and L5-S1  -MRI C/T spine w/ and w/out: no new enhancing lesions  -Ortho Consult: No intervention necessary on bulging disc in lumbar spine, recommend pain consult for steroid injection  -Reported improvement in incontinence issues since being admitted    >Epidural steroid injection today, medically stable for discharge home following the procedure    >Continue baclofen 10mg  TID   >Continue Gabapentin 600mg  TID   >Continue Norco BID   >Trazodone prn insomniaa   >Will provide referral for outpatient PT/OT on discharge     R sided back and hip pain   > Tylenol, baclofen, gabapentin, and norco all available   > Added topical amitriptyline/emu oil/gabapentin cream    Migraines   >Continue pta Topamax    Hypothyroidism, Graves   >Continue pta synthroid    Renal/GU: Complaints of incontinence.    -BUN/CR unremarkable at 12/0.78   >Monitor I/Os    FEN/GI: Regular diet. Monitor I/Os.      Code: Full   PPX: Lovenox, SCDS    The patient was seen and discussed with Dr. Debroah Loop       _____________________________________________________________________  Subjective: Pamela Kirk is a 46 y.o. female.  No new events noted. Patient reports continued back pains. Discussed MRI results in detail. Plan of care discussed.     Objective:                    Vital Signs:  Last Filed                   Vital Signs: 24 Hour Range   BP: 143/91 (08/28 0604)  Temp: 36.9 ???C (98.4 ???F) (08/28 0604)  Pulse: 69 (08/28 0604)  Respirations: 14 PER MINUTE (08/28 0604)  SpO2: 94 % (08/28 0604)  O2 Delivery: None (Room Air) (08/28 0604)  BP: (143-156)/(89-98)   Temp:  [36.7 ???C (98.1 ???F)-36.9 ???C (98.5 ???F)]   Pulse:  [57-69]   Respirations:  [14 PER MINUTE-17 PER MINUTE]   SpO2:  [94 %-99 %]   O2 Delivery: None (Room Air)    Intensity Pain Scale 0-10 (Pain 1): (not recorded)    Intake/Output Summary: (Last 24 hours)    Intake/Output Summary (Last 24 hours) at 12/23/16 0709  Last data filed at 12/22/16 1809   Gross per 24 hour   Intake             1330 ml   Output                0 ml   Net             1330 ml      Stool Occurrence: 0  Medications:  Scheduled  Meds:    amitriptyline/gabapentin/emu oil(#) 07/30/08 % topical cream  Topical Q8H   baclofen (LIORESAL) tablet 10 mg 10 mg Oral TID   enoxaparin (LOVENOX) syringe 40 mg 40 mg Subcutaneous QDAY(21)   gabapentin (NEURONTIN) capsule 600 mg 600 mg Oral TID   HYDROcodone/acetaminophen (NORCO) 5/325 mg tablet 1.5 tablet 1.5 tablet Oral BID   levothyroxine (SYNTHROID) tablet 125 mcg 125 mcg Oral QDAY   topiramate (TOPAMAX) tablet 100 mg 100 mg Oral QHS   Continuous Infusions:  PRN and Respiratory Meds:acetaminophen Q6H PRN, albuterol Q4H PRN, traZODone QHS PRN     General physical exam:    HEENT: normocephalic    Neuro exam:   Mental status: alert, oriented to person/place/time  Speech:    Normal Abnormal   Fluency x    Comprehension x    Articulation x                  Cranial Nerves:    Normal Abnormal   II PERRL    III, IV, VI EOMI, no nystagmus    V Sensation nml V1-V3    VII            Symmetric smile      VIII nml to finger rub    IX, X Palate Even XI Equal shoulder shrug    XII Tongue midline        Muscle/motor:   Tone: WNL  Bulk: WNL     NF NE SA EF EE WE WF FF FE FA TA HF HA HE KF KE DF PF In Ev TF TE   R   5 5 5 5 5 5    5   5 5 5 5        L   5 5 5 5 5 5    5   5 5 5 5            Sensation:    Normal RUE LUE RLE LLE   Light Touch x       Pin Prick        Temperature        Vibration        Proprioception            Coordination:    Normal Abnormal Right Abnormal Left   Finger to Nose x     Rapid alternating       Heel to [employer] tap      Foot tap      Other        Gait and Sation:  Regular gait: Deferred    Reflexes:    Right Left   Triceps 2 2   Biceps 2 2   Brachioradialis 3 3   Patella 3 3               Laboratory Review:     No results found for: PHART, PCO2A, PO2ART, HCO3A, BASEEXA, BASEDEFA, O2SATACAL  Lab Results   Component Value Date    HGB 12.3 12/19/2016    HCT 37.5 12/19/2016    PLTCT 273 12/19/2016    WBC 10.4 12/19/2016    NEUT 47 12/19/2016    ANC 5.10 12/19/2016     Lab Results   Component Value Date    NA 139 12/19/2016    K 3.3 (L) 12/19/2016    CL 109 12/19/2016    CO2 23 12/19/2016    GAP 7 12/19/2016    BUN  12 12/19/2016    CR 0.78 12/19/2016    GLU 95 12/19/2016    CA 8.5 12/19/2016    MG 2.3 12/19/2016    TOTPROT 6.9 12/19/2016    AST 14 12/19/2016    ALT 9 12/19/2016    ALKPHOS 57 12/19/2016     No results found for: TNI, CKMB, MYOGLB  No results found for: PTT, INR, FIB, DIMER, FDP  Lab Results   Component Value Date    TSH 0.150 (L) 12/19/2016    FREET4R 1.3 12/19/2016     No results found for: CHOL, TRIG, HDL, LDL, VLDL  No results found for: VANRAN, VANPK, VANTR  No results found for: CYCLOSPOR, TACROLIMUS, FREEPHENY     Point of Care Testing:  (Last 24 hours):  Glucose: 95    Radiology and Other Diagnostics Review: Reviewed     Barb Merino  PGY-2  [phone]

## 2016-12-23 NOTE — Progress Notes
[facility]  INTERVENTIONAL PAIN PROCEDURE HISTORY AND PHYSICAL    No chief complaint on file.      HISTORY OF PRESENT ILLNESS:    Pamela Kirk is a 46 y.o. female is admitted with leg pain.  She localizes the pain to her left leg. She says it started about 3-4 weeks ago when she had a fall while she was on a float trip where she landed on her back.  The leg pain is gotten progressively worse since that time. The pain radiates from her back to her buttock and down the posterior aspect of her leg to the level of her knee. She has some numbness in the top of her left foot. She has some right leg pain but worse on the left.     Past Medical History:   Diagnosis Date   ??? Graves disease    ??? Multiple sclerosis (HCC)        Past Surgical History:   Procedure Laterality Date   ??? TUBAL LIGATION  2001   ??? THYROIDECTOMY  11/2012       family history is not on file.    Social History     Social History   ??? Marital status: Married     Spouse name: N/A   ??? Number of children: N/A   ??? Years of education: N/A     Occupational History   ??? Not on file.     Social History Main Topics   ??? Smoking status: Current Every Day Smoker     Packs/day: 1.00     Types: Cigarettes   ??? Smokeless tobacco: Not on file   ??? Alcohol use Yes      Comment: occ   ??? Drug use: No   ??? Sexual activity: Not on file     Other Topics Concern   ??? Not on file     Social History Narrative   ??? No narrative on file       Allergies   Allergen Reactions   ??? Codeine HIVES   ??? Iodine RASH   ??? Other [Unclassified Drug] HIVES     Hair coloring agent, unknown which agent   ??? Latex, Natural Rubber RASH       Vitals:    12/23/16 1033   Weight: 74.8 kg (165 lb)   Height: 162.6 cm (64)       REVIEW OF SYSTEMS: 10 point ROS obtained and negative except per HPI      PHYSICAL EXAM:  Gen: Alert x 3  Chest: CTAB  Neck:Supple  Psych: Normal mood and affect  Skin: no rashes or lesions  Neuro: Grossly intact  Musc:     Tenderness in L-spine        IMPRESSION: 1. Lumbosacral radiculopathy due to intervertebral disc disorder         PLAN: Lumbar Transforaminal Steroid Injection

## 2016-12-23 NOTE — Procedures
Attending Surgeon: Bella Kennedy, MD    Anesthesia: Local    Pre-Procedure Diagnosis:   1. Lumbosacral radiculopathy due to intervertebral disc disorder        Post-Procedure Diagnosis:   1. Lumbosacral radiculopathy due to intervertebral disc disorder        SNR/TF LMBR/SAC  Procedure: transforaminal epidural    Laterality: left  Location: sacral - S1-2      Consent:   Consent obtained: verbal and written  Consent given by: patient  Risks discussed: allergic reaction, bleeding, bruising, infection, nerve damage, no change or worsening in pain, weakness, swelling and reaction to medication  Alternatives discussed: alternative treatment and delayed treatment  Discussed with patient the purpose of the treatment/procedure, other ways of treating my condition, including no treatment/ procedure and the risks and benefits of the alternatives. Patient has decided to proceed with treatment/procedure.        Universal Protocol:  Relevant documents: relevant documents present and verified  Test results: test results available and properly labeled  Imaging studies: imaging studies available  Required items: required blood products, implants, devices, and special equipment available  Site marked: the operative site was marked  Patient identity confirmed: Patient identify confirmed verbally with patient.      Time out: Immediately prior to procedure a time out was called to verify the correct patient, procedure, equipment, support staff and site/side marked as required      Procedures Details:   Indications: pain   Prep: Betadine  Patient position: prone  Estimated Blood Loss: minimal  Specimens: none  Amount Injected:   S1-2: 2mL    Number of Levels: 1  Approach: left paramedian  Guidance: fluoroscopy  Contrast: Procedure confirmed with contrast under live fluoroscopy.  Needle size: 25 G  Injection procedure: Incremental injection and Negative aspiration for blood Patient tolerance: Patient tolerated the procedure well with no immediate complications. Pressure was applied, and hemostasis was accomplished.  Outcome: Pain unchanged  Comments: 40mg  of depomedrol and 1ml of normal saline injected at each level      Estimated blood loss: none or minimal  Specimens: none  Patient tolerated the procedure well with no immediate complications. Pressure was applied, and hemostasis was accomplished.

## 2016-12-23 NOTE — Procedures
Attending Surgeon: Bella Kennedy, MD    Anesthesia: Local    Pre-Procedure Diagnosis:   1. Lumbosacral radiculopathy due to intervertebral disc disorder        Post-Procedure Diagnosis:   1. Lumbosacral radiculopathy due to intervertebral disc disorder        SNR/TF LMBR/SAC  Procedure: transforaminal epidural    Laterality: left  Location: lumbar - L5-S1      Consent:   Consent obtained: verbal and written  Consent given by: patient  Risks discussed: allergic reaction, bleeding, bruising, infection, nerve damage, no change or worsening in pain, weakness, swelling and reaction to medication  Alternatives discussed: alternative treatment and delayed treatment  Discussed with patient the purpose of the treatment/procedure, other ways of treating my condition, including no treatment/ procedure and the risks and benefits of the alternatives. Patient has decided to proceed with treatment/procedure.        Universal Protocol:  Relevant documents: relevant documents present and verified  Test results: test results available and properly labeled  Imaging studies: imaging studies available  Required items: required blood products, implants, devices, and special equipment available  Site marked: the operative site was marked  Patient identity confirmed: Patient identify confirmed verbally with patient.      Time out: Immediately prior to procedure a time out was called to verify the correct patient, procedure, equipment, support staff and site/side marked as required      Procedures Details:   Indications: pain   Prep: Betadine  Patient position: prone  Estimated Blood Loss: minimal  Specimens: none  Amount Injected:   L5-S1: 2mL    Number of Levels: 1  Approach: left paramedian  Guidance: fluoroscopy  Contrast: Procedure confirmed with contrast under live fluoroscopy.  Needle size: 25 G  Injection procedure: Incremental injection and Negative aspiration for blood Patient tolerance: Patient tolerated the procedure well with no immediate complications. Pressure was applied, and hemostasis was accomplished.  Outcome: Pain unchanged  Comments: 40mg  of depomedrol and 1ml of normal saline injected at each level      Estimated blood loss: none or minimal  Specimens: none  Patient tolerated the procedure well with no immediate complications. Pressure was applied, and hemostasis was accomplished.

## 2017-02-02 ENCOUNTER — Ambulatory Visit: Admit: 2017-02-02 | Discharge: 2017-02-03 | Payer: Commercial Managed Care - PPO

## 2017-02-02 ENCOUNTER — Encounter: Admit: 2017-02-02 | Discharge: 2017-02-02

## 2017-02-02 DIAGNOSIS — G35 Multiple sclerosis: ICD-10-CM

## 2017-02-02 DIAGNOSIS — E0500 Thyrotoxicosis with diffuse goiter without thyrotoxic crisis or storm: Principal | ICD-10-CM

## 2017-02-02 DIAGNOSIS — M5127 Other intervertebral disc displacement, lumbosacral region: ICD-10-CM

## 2017-02-03 ENCOUNTER — Encounter: Admit: 2017-02-03 | Discharge: 2017-02-03

## 2017-02-03 DIAGNOSIS — M5417 Radiculopathy, lumbosacral region: Principal | ICD-10-CM

## 2017-02-03 DIAGNOSIS — G35 Multiple sclerosis: ICD-10-CM

## 2017-02-03 DIAGNOSIS — E0500 Thyrotoxicosis with diffuse goiter without thyrotoxic crisis or storm: ICD-10-CM

## 2017-02-10 ENCOUNTER — Encounter: Admit: 2017-02-10 | Discharge: 2017-02-10

## 2017-02-10 DIAGNOSIS — M545 Low back pain: Principal | ICD-10-CM

## 2017-02-12 ENCOUNTER — Ambulatory Visit: Admit: 2017-02-12 | Discharge: 2017-02-12

## 2017-02-12 ENCOUNTER — Ambulatory Visit: Admit: 2017-02-12 | Discharge: 2017-02-12 | Payer: Commercial Managed Care - PPO

## 2017-02-12 ENCOUNTER — Encounter: Admit: 2017-02-12 | Discharge: 2017-02-12

## 2017-02-12 DIAGNOSIS — E0500 Thyrotoxicosis with diffuse goiter without thyrotoxic crisis or storm: Principal | ICD-10-CM

## 2017-02-12 DIAGNOSIS — M5126 Other intervertebral disc displacement, lumbar region: ICD-10-CM

## 2017-02-12 DIAGNOSIS — M545 Low back pain: Principal | ICD-10-CM

## 2017-02-12 DIAGNOSIS — G35 Multiple sclerosis: ICD-10-CM

## 2017-02-20 ENCOUNTER — Ambulatory Visit: Admit: 2017-02-20 | Discharge: 2017-02-21 | Payer: Commercial Managed Care - PPO

## 2017-02-20 ENCOUNTER — Encounter: Admit: 2017-02-20 | Discharge: 2017-02-20

## 2017-02-20 DIAGNOSIS — G35 Multiple sclerosis: ICD-10-CM

## 2017-02-20 DIAGNOSIS — M5416 Radiculopathy, lumbar region: Principal | ICD-10-CM

## 2017-02-20 DIAGNOSIS — E0500 Thyrotoxicosis with diffuse goiter without thyrotoxic crisis or storm: Principal | ICD-10-CM

## 2017-02-20 DIAGNOSIS — M5117 Intervertebral disc disorders with radiculopathy, lumbosacral region: ICD-10-CM

## 2017-02-20 MED ORDER — GADOBENATE DIMEGLUMINE 529 MG/ML (0.1MMOL/0.2ML) IV SOLN
2.5 mL | Freq: Once | 0 refills | Status: CP
Start: 2017-02-20 — End: ?
  Administered 2017-02-20: 19:00:00 2.5 mL

## 2017-02-20 MED ORDER — BUPIVACAINE (PF) 0.5 % (5 MG/ML) IJ SOLN
2 mL | Freq: Once | INTRAMUSCULAR | 0 refills | Status: CP
Start: 2017-02-20 — End: ?
  Administered 2017-02-20: 19:00:00 2 mL via INTRAMUSCULAR

## 2017-02-20 NOTE — Progress Notes
[facility]  INTERVENTIONAL PAIN PROCEDURE HISTORY AND PHYSICAL    Chief Complaint   Patient presents with    Lower Back - Pain       HISTORY OF PRESENT ILLNESS:    Left sided lower back and leg pain  Pain worse with sitting  Radiates into foot    Past Medical History:   Diagnosis Date    Graves disease     Graves disease     Multiple sclerosis (HCC)        Past Surgical History:   Procedure Laterality Date    TUBAL LIGATION  2001    THYROIDECTOMY  11/2012       family history includes Cancer in her maternal grandfather, maternal grandmother, and paternal grandfather; Diabetes in her father; Hypertension in her father and mother.    Social History     Social History    Marital status: Married     Spouse name: N/A    Number of children: N/A    Years of education: N/A     Occupational History    Not on file.     Social History Main Topics    Smoking status: Current Every Day Smoker     Packs/day: 1.00     Types: Cigarettes    Smokeless tobacco: Current User    Alcohol use Yes      Comment: occ    Drug use: No    Sexual activity: Not on file     Other Topics Concern    Not on file     Social History Narrative    No narrative on file       Allergies   Allergen Reactions    Codeine HIVES    Iodine RASH    Other [Unclassified Drug] HIVES     Hair coloring agent, unknown which agent    Latex, Natural Rubber RASH       Vitals:    02/20/17 1301   BP: (!) 177/100   Pulse: 68   Resp: 18   Temp: 37.1 C (98.7 F)   TempSrc: Oral   SpO2: 97%   Weight: 74.8 kg (165 lb)   Height: 160 cm (63")       REVIEW OF SYSTEMS: 10 point ROS obtained and negative except per HPI      PHYSICAL EXAM:  Gen: Alert x 3  Chest: CTAB  Neck:Supple  Psych: Normal mood and affect  Skin: no rashes or lesions  Neuro: Grossly intact  Musc:     Tenderness in L-spine        IMPRESSION:    1. Lumbosacral radiculopathy due to intervertebral disc disorder    2. Lumbar herniated disc         PLAN:   Diagnostic S1 block with local

## 2017-02-20 NOTE — Procedures
Attending Surgeon: Bella Kennedy, MD    Anesthesia: Local    Pre-Procedure Diagnosis:   1. Lumbosacral radiculopathy due to intervertebral disc disorder    2. Lumbar herniated disc        Post-Procedure Diagnosis:   1. Lumbosacral radiculopathy due to intervertebral disc disorder    2. Lumbar herniated disc         AMB SPINE INJECT SNRB/TFESI LUMBAR/SACRAL  Procedure: epidural - selective nerve root    Laterality: left  Location: sacral - S1-2      Consent:   Consent obtained: verbal and written  Consent given by: patient  Risks discussed: allergic reaction, bleeding, bruising, infection, nerve damage, no change or worsening in pain, weakness, swelling and reaction to medication  Alternatives discussed: alternative treatment and delayed treatment  Discussed with patient the purpose of the treatment/procedure, other ways of treating my condition, including no treatment/ procedure and the risks and benefits of the alternatives. Patient has decided to proceed with treatment/procedure.        Universal Protocol:  Relevant documents: relevant documents present and verified  Test results: test results available and properly labeled  Imaging studies: imaging studies available  Required items: required blood products, implants, devices, and special equipment available  Site marked: the operative site was marked  Patient identity confirmed: Patient identify confirmed verbally with patient.      Time out: Immediately prior to procedure a time out was called to verify the correct patient, procedure, equipment, support staff and site/side marked as required      Procedures Details:   Indications: pain   Prep: Betadine  Patient position: prone  Estimated Blood Loss: minimal  Specimens: none  Amount Injected:   S1-2: 1mL    Number of Levels: 1  Approach: left paramedian  Guidance: fluoroscopy  Contrast: Procedure confirmed with contrast under live fluoroscopy.  Needle size: 25 G Injection procedure: Incremental injection and Negative aspiration for blood  Patient tolerance: Patient tolerated the procedure well with no immediate complications. Pressure was applied, and hemostasis was accomplished.  Outcome: Pain unchanged  Comments: 2ml of 0.5% bupivicaine       Estimated blood loss: none or minimal  Specimens: none  Patient tolerated the procedure well with no immediate complications. Pressure was applied, and hemostasis was accomplished.

## 2017-02-21 ENCOUNTER — Ambulatory Visit: Admit: 2017-02-20 | Discharge: 2017-02-21

## 2017-02-21 DIAGNOSIS — F17290 Nicotine dependence, other tobacco product, uncomplicated: ICD-10-CM

## 2017-02-21 DIAGNOSIS — F17210 Nicotine dependence, cigarettes, uncomplicated: ICD-10-CM

## 2017-02-21 DIAGNOSIS — M5417 Radiculopathy, lumbosacral region: Principal | ICD-10-CM

## 2017-02-21 DIAGNOSIS — M5126 Other intervertebral disc displacement, lumbar region: ICD-10-CM

## 2017-02-21 DIAGNOSIS — G35 Multiple sclerosis: ICD-10-CM

## 2017-02-21 DIAGNOSIS — E0500 Thyrotoxicosis with diffuse goiter without thyrotoxic crisis or storm: ICD-10-CM

## 2017-02-24 ENCOUNTER — Encounter: Admit: 2017-02-24 | Discharge: 2017-02-24

## 2017-03-06 ENCOUNTER — Encounter: Admit: 2017-03-06 | Discharge: 2017-03-06

## 2017-03-06 NOTE — Telephone Encounter
RN plans to have surgery with Dr. Jean Rosenthal in January, 2019. RN offered to make office visit if patient wants follow up. She will wait for now due to her anxiety with injections.

## 2017-03-11 ENCOUNTER — Encounter: Admit: 2017-03-11 | Discharge: 2017-03-11

## 2017-03-11 DIAGNOSIS — M5127 Other intervertebral disc displacement, lumbosacral region: Principal | ICD-10-CM

## 2017-03-11 DIAGNOSIS — M5126 Other intervertebral disc displacement, lumbar region: ICD-10-CM

## 2017-04-23 ENCOUNTER — Ambulatory Visit: Admit: 2017-04-23 | Discharge: 2017-04-23

## 2017-04-23 ENCOUNTER — Encounter: Admit: 2017-04-23 | Discharge: 2017-04-23

## 2017-04-23 ENCOUNTER — Ambulatory Visit: Admit: 2017-04-23 | Discharge: 2017-04-24 | Payer: Commercial Managed Care - PPO

## 2017-04-23 DIAGNOSIS — G35 Multiple sclerosis: ICD-10-CM

## 2017-04-23 DIAGNOSIS — E0500 Thyrotoxicosis with diffuse goiter without thyrotoxic crisis or storm: Principal | ICD-10-CM

## 2017-04-23 DIAGNOSIS — G459 Transient cerebral ischemic attack, unspecified: ICD-10-CM

## 2017-04-23 DIAGNOSIS — R569 Unspecified convulsions: ICD-10-CM

## 2017-04-23 DIAGNOSIS — I10 Essential (primary) hypertension: ICD-10-CM

## 2017-04-23 LAB — COMPREHENSIVE METABOLIC PANEL
Lab: 0.2 mg/dL — ABNORMAL LOW (ref 0.3–1.2)
Lab: 0.7 mg/dL (ref >60)
Lab: 110 MMOL/L (ref 98–110)
Lab: 13 mg/dL (ref >60)
Lab: 138 MMOL/L (ref 137–147)
Lab: 23 MMOL/L (ref 21–30)
Lab: 3 U/L — ABNORMAL LOW (ref 7–56)
Lab: 3.6 MMOL/L (ref 3.5–5.1)
Lab: 4 g/dL (ref 3.5–5.0)
Lab: 6.6 g/dL (ref 6.0–8.0)
Lab: 71 U/L (ref 25–110)
Lab: 8 U/L (ref 7–40)
Lab: 8.8 mg/dL (ref 8.5–10.6)
Lab: 88 mg/dL — ABNORMAL HIGH (ref 70–100)
Lab: >60 mL/min (ref >60)
Lab: >60 mL/min (ref >60)
Lab: 5 (ref 3–12)

## 2017-04-23 LAB — CBC
Lab: 4 M/UL — ABNORMAL HIGH (ref 4.0–5.0)
Lab: 9.6 K/UL (ref 4.5–11.0)

## 2017-04-23 NOTE — Progress Notes
SPINE CENTER HISTORY AND PHYSICAL    Chief Complaint   Patient presents with   ??? Lower Back - Pain       Subjective     HISTORY:  Pamela Kirk is a 46 year old female seen in the office today for her history and physical.  She is scheduled for surgery on May 05, 2017.  She had a traumatic event back on 11-17-16 in which she had a seizure and fell forwards and significantly aggravated her back and legs.  Prior to that she was not really having much in the way of back problems and really had no leg symptoms.  She does have a history of MS which was diagnosed when she was 45, however, that currently is in remission.  She was hospitalized at the time of this accident and was diagnosed with a herniated disc.  She saw Dr. Janyth Contes and underwent one epidural steroid injection.  She reports that that really did not help her very much.  She continues to have significant low back and left greater than right leg pain.  She reports the pain in her left leg radiates down the back of the left leg all the way to the bottom of her foot.  She certainly does have some symptoms on the right as well but not nearly as bad.  Her low back is probably equal to the left buttocks and left leg symptoms.     PHYSICAL EXAMINATION:  Limited range of motion, however, secondary to back pain.  Motor examination of the lower extremities is full to one time testing including EHL, anterior tib, quads and hip flexors.  Sensation diminished distally in the left leg, intact distally on the right. Reflexes are normal.  No mechanical hip pain to range of motion.  Negative straight leg raise on the right, significantly positive straight leg raise on the left reproducing left buttocks and thigh pain.  ???  X-RAYS:  X-rays of the lumbar spine shows normal alignment.  There is a little bit of asymmetrical collapse of the disc space at L4-5, which does cause a subtle curve but nothing significant.  Lateral view shows degeneration to the L4-5 and L5-S1 disc.  No anterior listhesis is noted.      DIAGNOSTIC STUDIES:  MRI of the lumbar spine dated 12-20-16 confirms the presence of some moderate diffuse degenerative changes greatest at L4-5 and L5-S1.  Axial images show fairly large central to slightly left sided herniated nucleus pulposus at L5-S1.  No areas of nerve compression above that are noted.    ???  IMPRESSION: Left L5-S1 herniated nucleus pulposus.     PLAN:  I had a lengthy discussion with the patient about the problem and treatment alternatives.  I have again reviewed her diagnostic studies, as well as the pathophysiology and pathoanatomy of the problem.  We went over her options for treatment which include continued conservative measures such as activity modifications, anti-inflammatories, pain medicines and physical therapy vs. repeat injections vs. considering a surgery.  To consider surgery for this, really the only surgical alternative that we would have to offer her would be a left L5-S1 discectomy.  This would be to trim off that piece of disc tail that is irritating the left S1 nerve.  That would be most effective at helping the buttocks and left leg pain.  However, the fact that she really did not get much benefit from the epidural certainly does give [place] some concern that she would not do well following surgery.  It certainly would not treat her back pain which is at least 50% of her problem and I think it potentially could make that worse.  She has pursued appropriate conservative management and the strongest option is surgery and surgery would entail a microdiscectomy.  I have discussed the risks and potential complications of surgery, including, but not limited to, anesthesia, bleeding, infection, neurovascular injury, spinal fluid leak, re-herniation, instability and persistent pain. We have also discussed the convalescence and recovery.  She elects to proceed with surgery and her informed consent was obtained today.  She was forwarded to the pre-assessment clinic for evaluation.  I have tried to answer all her questions at length.                 Past Medical History:   Diagnosis Date   ??? Graves disease    ??? Graves disease    ??? Multiple sclerosis (HCC)        Past Surgical History:   Procedure Laterality Date   ??? TUBAL LIGATION  2001   ??? THYROIDECTOMY  11/2012       family history includes Cancer in her maternal grandfather, maternal grandmother, and paternal grandfather; Diabetes in her father; Hypertension in her father and mother.    Social History     Socioeconomic History   ??? Marital status: Married     Spouse name: Not on file   ??? Number of children: Not on file   ??? Years of education: Not on file   ??? Highest education level: Not on file   Social Needs   ??? Financial resource strain: Not on file   ??? Food insecurity - worry: Not on file   ??? Food insecurity - inability: Not on file   ??? Transportation needs - medical: Not on file   ??? Transportation needs - non-medical: Not on file   Occupational History   ??? Not on file   Tobacco Use   ??? Smoking status: Current Every Day Smoker     Packs/day: 1.00     Types: Cigarettes   ??? Smokeless tobacco: Current User   Substance and Sexual Activity   ??? Alcohol use: Yes     Comment: occ   ??? Drug use: No   ??? Sexual activity: Not on file   Other Topics Concern   ??? Not on file   Social History Narrative   ??? Not on file       Allergies   Allergen Reactions   ??? Codeine HIVES   ??? Iodine RASH   ??? Other [Unclassified Drug] HIVES     Hair coloring agent, unknown which agent   ??? Latex, Natural Rubber RASH         Current Outpatient Medications:   ???  acetaminophen (TYLENOL) 325 mg tablet, Take two tablets by mouth every 12 hours as needed for Pain., Disp: , Rfl:   ???  ALPRAZolam(+) (XANAX) 2 mg tablet, Take 2 mg by mouth at bedtime daily., Disp: , Rfl:   ???  amitriptyline HCl (AMITRIPTYLINE/GABAPENTIN/EMU OIL(#)) 07/30/08 %, Please lightly apply to affected area as needed twice daily, Disp: 50 g, Rfl: 1  ???  amitriptyline HCl (AMITRIPTYLINE/GABAPENTIN/EMU OIL(#)) 07/30/08 %, Apply liberally to affected regions., Disp: 250 g, Rfl: 1  ???  baclofen (LIORESAL) 10 mg tablet, TAKE ONE TABLET BY MOUTH TWICE DAILY FOR MUSCLE SPASM OF  SPINAL  ORIGEN, Disp: 180 tablet, Rfl: 3  ???  dextroamphetamine-amphetamine(+) (ADDERALL) 30 mg tablet, Take 30 mg by  mouth twice daily, Disp: , Rfl:   ???  diclofenac submicronized (ZORVOLEX) 35 mg cap, Take 1 capsule by mouth three times daily., Disp: , Rfl:   ???  gabapentin (NEURONTIN) 600 mg tablet, Take one tablet by mouth three times daily., Disp: , Rfl:   ???  HYDROcodone/acetaminophen(+) (NORCO) 10/325 mg tablet, Take 1 tablet by mouth twice daily, Disp: , Rfl:   ???  levothyroxine (SYNTHROID) 125 mcg tablet, Take 125 mcg by mouth daily 30 minutes before breakfast., Disp: , Rfl:   ???  losartan(+) (COZAAR) 100 mg tablet, Take 100 mg by mouth daily., Disp: , Rfl:   ???  metoprolol XL (TOPROL XL) 25 mg extended release tablet, Take 25 mg by mouth daily., Disp: , Rfl:   ???  rizatriptan (MAXALT) 5 mg tablet, 1 Tab as Needed. May repeat in 2 hours in needed  Indications: MIGRAINE, Disp: 12 Tab, Rfl: 5  ???  sertraline (ZOLOFT) 100 mg tablet, Take 100 mg by mouth daily., Disp: , Rfl:   ???  tiZANidine (ZANAFLEX) 4 mg tablet, Take 4 mg by mouth three times daily as needed., Disp: , Rfl:   ???  topiramate (TOPAMAX) 100 mg tablet, Take 100 mg by mouth daily., Disp: , Rfl:   ???  traZODone (DESYREL) 50 mg tablet, Take 1 Tab by mouth at bedtime as needed., Disp: 90 Tab, Rfl: 1    Vitals:    04/23/17 1104   BP: (!) (P) 150/97   Pulse: (P) 73   SpO2: (P) 100%   Weight: 73.5 kg (162 lb)   Height: 160 cm (62.99)       Oswestry Total Score:: 64         Body mass index is 28.7 kg/m???.    Review of Systems           In the presence of Dr. Jean Rosenthal, I am taking down these notes, D. Lynch, scribe. 04/23/17 @ 11:30 am

## 2017-04-23 NOTE — Pre-Anesthesia Patient Instructions
GENERAL INFORMATION    Before you come to the hospital  ??? Make arrangements for a responsible adult to drive you home and stay with you for 24 hours following surgery.  ??? Bath/Shower Instructions  ??? Leave money, credit cards, jewelry, and any other valuables at home. The [facility] is not responsible for the loss or breakage of personal items.  ??? Follow instructions for bathing from Dr. Edison Pace office, sleep in clean clothes and on clean bedding.  ??? Remove nail polish, makeup and all jewelry (including piercings) before coming to the hospital.  ??? The morning of your procedure:  ??? brush your teeth and tongue  ??? do not smoke  ??? do not shave the area where you will have surgery    What to bring to the hospital  ??? ID/ Insurance Card  ??? Medical Device card  ??? Official documents for legal guardianship   ??? Copy of your Living Will, Advanced Directives, and/or Durable Power of Attorney   ??? Small bag with a few personal belongings  ??? CPAP/BiPAP machine (including all supplies)  ??? Walker,cane, or motorized scooter  ??? Cases for glasses/hearing aids/contact lens (bring solutions for contacts)  ??? Dress in clean, loose, comfortable clothing     Eating or drinking before surgery  ??? Do not eat or drink anything after 11:00 p.m. the day before your procedure (including gum, mints, candy, or chewing tobacco) OR follow the specific instructions you were given by your Surgeon.  ??? You may have WATER ONLY up to 2 hours before arriving at the hospital.  ??? Other instructions:      Other instructions  Notify your surgeon if:  ??? there is a possibility that you are pregnant  ??? you become ill with a cough, fever, sore throat, nausea, vomiting or flu-like symptoms  ??? you have any open wounds/sores that are red, painful, draining, or are new since you last saw  the doctor  ??? you need to cancel your procedure  ??? You will receive a call with your surgery arrival time from between 2:30pm and 4:30pm the last business day before your procedure.  If you do not receive a call, please call [phone] before 4:30pm or [phone] after 4:30pm.    Notify us at [facility]: [phone]  ??? if you need to cancel your procedure  ??? if you are going to be late    Arrival at the hospital  [facility]  [address]  [place], [place] [address]    ??? Park in the [employer], located directly across from the main entrance to the hospital.  ??? Valet parking is available  from 7 AM to 4 PM Monday through Friday.  ??? Enter through the ground floor main hospital entrance and check in at the Information Desk in the lobby.  ??? They will validate your parking ticket and direct you to the next location.  ??? If you are a woman between the ages of 16 and 53, and have not had a hysterectomy, you will be asked for a urine sample prior to surgery.  Please do not urinate before arriving in the Surgery Waiting Room.  Once there, check in and let the attendant know if you need to provide a sample.

## 2017-04-24 DIAGNOSIS — M5127 Other intervertebral disc displacement, lumbosacral region: ICD-10-CM

## 2017-04-24 DIAGNOSIS — M5126 Other intervertebral disc displacement, lumbar region: Principal | ICD-10-CM

## 2017-05-05 ENCOUNTER — Encounter: Admit: 2017-05-05 | Discharge: 2017-05-05

## 2017-05-05 ENCOUNTER — Encounter: Admit: 2017-05-05 | Discharge: 2017-05-06 | Payer: Commercial Managed Care - PPO

## 2017-05-05 LAB — PREGNANCY TEST-URINE
Lab: NEGATIVE
Lab: 1

## 2017-05-05 MED ORDER — OXYCODONE 5 MG PO TAB
5-10 mg | Freq: Once | ORAL | 0 refills | Status: DC | PRN
Start: 2017-05-05 — End: 2017-05-05

## 2017-05-05 MED ORDER — ACETAMINOPHEN 650 MG RE SUPP
650 mg | RECTAL | 0 refills | Status: DC | PRN
Start: 2017-05-05 — End: 2017-05-06

## 2017-05-05 MED ORDER — TOPIRAMATE 100 MG PO TAB
100 mg | Freq: Every evening | ORAL | 0 refills | Status: DC
Start: 2017-05-05 — End: 2017-05-06
  Administered 2017-05-06: 03:00:00 100 mg via ORAL

## 2017-05-05 MED ORDER — FENTANYL CITRATE (PF) 50 MCG/ML IJ SOLN
50 ug | INTRAVENOUS | 0 refills | Status: DC | PRN
Start: 2017-05-05 — End: 2017-05-05

## 2017-05-05 MED ORDER — KETAMINE 10 MG/ML IJ SOLN
0 refills | Status: DC
Start: 2017-05-05 — End: 2017-05-05
  Administered 2017-05-05: 19:00:00 30 mg via INTRAVENOUS

## 2017-05-05 MED ORDER — GABAPENTIN 300 MG PO CAP
300 mg | Freq: Once | ORAL | 0 refills | Status: CP
Start: 2017-05-05 — End: ?
  Administered 2017-05-05: 18:00:00 300 mg via ORAL

## 2017-05-05 MED ORDER — SODIUM CHLORIDE 0.9 % IV SOLP
INTRAVENOUS | 0 refills | Status: DC
Start: 2017-05-05 — End: 2017-05-06
  Administered 2017-05-05 – 2017-05-06 (×3): 1000.000 mL via INTRAVENOUS

## 2017-05-05 MED ORDER — MIDAZOLAM 1 MG/ML IJ SOLN
INTRAVENOUS | 0 refills | Status: DC
Start: 2017-05-05 — End: 2017-05-05

## 2017-05-05 MED ORDER — DIPHENHYDRAMINE HCL 50 MG/ML IJ SOLN
25 mg | Freq: Once | INTRAVENOUS | 0 refills | Status: DC | PRN
Start: 2017-05-05 — End: 2017-05-05

## 2017-05-05 MED ORDER — IMS MIXTURE TEMPLATE
125 ug | Freq: Every day | ORAL | 0 refills | Status: DC
Start: 2017-05-05 — End: 2017-05-06
  Administered 2017-05-06 (×2): 125 ug via ORAL

## 2017-05-05 MED ORDER — HYDROMORPHONE 2 MG/ML IJ SOLN
0 refills | Status: DC
Start: 2017-05-05 — End: 2017-05-05
  Administered 2017-05-05 (×2): 0.5 mg via INTRAVENOUS

## 2017-05-05 MED ORDER — ACETAMINOPHEN 500 MG PO TAB
1000 mg | Freq: Once | ORAL | 0 refills | Status: CP
Start: 2017-05-05 — End: ?
  Administered 2017-05-05: 18:00:00 1000 mg via ORAL

## 2017-05-05 MED ORDER — HYDROMORPHONE (PF) 2 MG/ML IJ SYRG
.5-1 mg | INTRAVENOUS | 0 refills | Status: DC | PRN
Start: 2017-05-05 — End: 2017-05-05

## 2017-05-05 MED ORDER — ONDANSETRON HCL (PF) 4 MG/2 ML IJ SOLN
4 mg | Freq: Once | INTRAVENOUS | 0 refills | Status: DC | PRN
Start: 2017-05-05 — End: 2017-05-05

## 2017-05-05 MED ORDER — ALPRAZOLAM 1 MG PO TAB
2 mg | Freq: Every evening | ORAL | 0 refills | Status: DC
Start: 2017-05-05 — End: 2017-05-06
  Administered 2017-05-06: 03:00:00 2 mg via ORAL

## 2017-05-05 MED ORDER — MAGNESIUM HYDROXIDE 2,400 MG/10 ML PO SUSP
10 mL | Freq: Every day | ORAL | 0 refills | Status: DC
Start: 2017-05-05 — End: 2017-05-06
  Administered 2017-05-06: 03:00:00 10 mL via ORAL

## 2017-05-05 MED ORDER — DOCUSATE SODIUM 100 MG PO CAP
100 mg | Freq: Two times a day (BID) | ORAL | 0 refills | Status: DC
Start: 2017-05-05 — End: 2017-05-06
  Administered 2017-05-06 (×2): 100 mg via ORAL

## 2017-05-05 MED ORDER — FENTANYL CITRATE (PF) 50 MCG/ML IJ SOLN
25 ug | INTRAVENOUS | 0 refills | Status: DC | PRN
Start: 2017-05-05 — End: 2017-05-05

## 2017-05-05 MED ORDER — SUGAMMADEX 100 MG/ML IV SOLN
0 refills | Status: DC
Start: 2017-05-05 — End: 2017-05-05
  Administered 2017-05-05: 20:00:00 150 mg via INTRAVENOUS

## 2017-05-05 MED ORDER — DIPHENHYDRAMINE HCL 25 MG PO CAP
25 mg | ORAL | 0 refills | Status: DC | PRN
Start: 2017-05-05 — End: 2017-05-06

## 2017-05-05 MED ORDER — GABAPENTIN 300 MG PO CAP
900 mg | ORAL | 0 refills | Status: DC
Start: 2017-05-05 — End: 2017-05-06
  Administered 2017-05-06 (×2): 900 mg via ORAL

## 2017-05-05 MED ORDER — TRAZODONE 100 MG PO TAB
100 mg | Freq: Every evening | ORAL | 0 refills | Status: DC | PRN
Start: 2017-05-05 — End: 2017-05-06
  Administered 2017-05-06: 03:00:00 100 mg via ORAL

## 2017-05-05 MED ORDER — LACTATED RINGERS IV SOLP
1000 mL | INTRAVENOUS | 0 refills | Status: DC
Start: 2017-05-05 — End: 2017-05-05
  Administered 2017-05-05: 18:00:00 1000 mL via INTRAVENOUS

## 2017-05-05 MED ORDER — ALBUTEROL SULFATE 90 MCG/ACTUATION IN HFAA
2 | RESPIRATORY_TRACT | 0 refills | Status: DC | PRN
Start: 2017-05-05 — End: 2017-05-06

## 2017-05-05 MED ORDER — LIDOCAINE (PF) 200 MG/10 ML (2 %) IJ SYRG
0 refills | Status: DC
Start: 2017-05-05 — End: 2017-05-05
  Administered 2017-05-05: 18:00:00 100 mg via INTRAVENOUS

## 2017-05-05 MED ORDER — DEXTRAN 70-HYPROMELLOSE (PF) 0.1-0.3 % OP DPET
0 refills | Status: DC
Start: 2017-05-05 — End: 2017-05-05
  Administered 2017-05-05: 18:00:00 2 [drp] via OPHTHALMIC

## 2017-05-05 MED ORDER — MAGNESIUM CHLORIDE 64 MG PO TBER
535 mg | Freq: Every day | ORAL | 0 refills | Status: DC
Start: 2017-05-05 — End: 2017-05-06
  Administered 2017-05-06: 15:00:00 535 mg via ORAL

## 2017-05-05 MED ORDER — DEXAMETHASONE SODIUM PHOSPHATE 4 MG/ML IJ SOLN
INTRAVENOUS | 0 refills | Status: DC
Start: 2017-05-05 — End: 2017-05-05
  Administered 2017-05-05: 18:00:00 4 mg via INTRAVENOUS

## 2017-05-05 MED ORDER — ACETAMINOPHEN 325 MG PO TAB
650 mg | ORAL | 0 refills | Status: DC | PRN
Start: 2017-05-05 — End: 2017-05-06
  Administered 2017-05-06: 15:00:00 650 mg via ORAL

## 2017-05-05 MED ORDER — FENTANYL PCA 550 MCG/55 ML SYR (STD CONC)(ADULT)(PREMADE)
INTRAVENOUS | 0 refills | Status: DC
Start: 2017-05-05 — End: 2017-05-06
  Administered 2017-05-05 – 2017-05-06 (×2): 55.000 mL via INTRAVENOUS

## 2017-05-05 MED ORDER — CEFAZOLIN INJ 1GM IVP
1 g | INTRAVENOUS | 0 refills | Status: CP
Start: 2017-05-05 — End: ?
  Administered 2017-05-06 (×2): 1 g via INTRAVENOUS

## 2017-05-05 MED ORDER — BUPIVACAINE 0.5 % (5 MG/ML) IJ SOLN
0 refills | Status: DC
Start: 2017-05-05 — End: 2017-05-05
  Administered 2017-05-05: 19:00:00 30 mL via SUBCUTANEOUS

## 2017-05-05 MED ORDER — ROCURONIUM 10 MG/ML IV SOLN
INTRAVENOUS | 0 refills | Status: DC
Start: 2017-05-05 — End: 2017-05-05
  Administered 2017-05-05: 18:00:00 50 mg via INTRAVENOUS

## 2017-05-05 MED ORDER — PROPOFOL INJ 10 MG/ML IV VIAL
0 refills | Status: DC
Start: 2017-05-05 — End: 2017-05-05
  Administered 2017-05-05: 18:00:00 150 mg via INTRAVENOUS

## 2017-05-05 MED ORDER — FENTANYL CITRATE (PF) 50 MCG/ML IJ SOLN
0 refills | Status: DC
Start: 2017-05-05 — End: 2017-05-05
  Administered 2017-05-05 (×2): 50 ug via INTRAVENOUS

## 2017-05-05 MED ORDER — DEXTROAMPHETAMINE-AMPHETAMINE 10 MG PO TAB
20 mg | Freq: Two times a day (BID) | ORAL | 0 refills | Status: DC
Start: 2017-05-05 — End: 2017-05-06
  Administered 2017-05-06: 15:00:00 20 mg via ORAL

## 2017-05-05 MED ORDER — THROMBIN (BOVINE) 5,000 UNIT TP SOLR
0 refills | Status: DC
Start: 2017-05-05 — End: 2017-05-05
  Administered 2017-05-05: 19:00:00 5000 [IU] via TOPICAL

## 2017-05-05 MED ORDER — DIPHENHYDRAMINE HCL 50 MG/ML IJ SOLN
25 mg | INTRAVENOUS | 0 refills | Status: DC | PRN
Start: 2017-05-05 — End: 2017-05-06

## 2017-05-05 MED ORDER — HALOPERIDOL LACTATE 5 MG/ML IJ SOLN
1 mg | Freq: Once | INTRAVENOUS | 0 refills | Status: DC | PRN
Start: 2017-05-05 — End: 2017-05-05

## 2017-05-05 MED ORDER — BISACODYL 10 MG RE SUPP
10 mg | Freq: Every day | RECTAL | 0 refills | Status: DC | PRN
Start: 2017-05-05 — End: 2017-05-06

## 2017-05-05 MED ORDER — NALOXONE 0.4 MG/ML IJ SOLN
.08 mg | INTRAVENOUS | 0 refills | Status: DC | PRN
Start: 2017-05-05 — End: 2017-05-06

## 2017-05-05 MED ORDER — METOPROLOL SUCCINATE 25 MG PO TB24
12.5 mg | Freq: Every day | ORAL | 0 refills | Status: DC
Start: 2017-05-05 — End: 2017-05-06
  Administered 2017-05-06: 15:00:00 12.5 mg via ORAL

## 2017-05-05 MED ORDER — ONDANSETRON HCL (PF) 4 MG/2 ML IJ SOLN
4 mg | INTRAVENOUS | 0 refills | Status: DC | PRN
Start: 2017-05-05 — End: 2017-05-06

## 2017-05-05 MED ORDER — CEFAZOLIN 1 GRAM IJ SOLR
0 refills | Status: DC
Start: 2017-05-05 — End: 2017-05-05
  Administered 2017-05-05: 19:00:00 2 g via INTRAVENOUS

## 2017-05-05 MED ORDER — BACLOFEN 20 MG PO TAB
20 mg | Freq: Three times a day (TID) | ORAL | 0 refills | Status: DC | PRN
Start: 2017-05-05 — End: 2017-05-06

## 2017-05-05 MED ORDER — TIZANIDINE 4 MG PO TAB
4 mg | ORAL | 0 refills | Status: DC | PRN
Start: 2017-05-05 — End: 2017-05-06
  Administered 2017-05-06: 03:00:00 4 mg via ORAL

## 2017-05-05 MED ORDER — ONDANSETRON HCL (PF) 4 MG/2 ML IJ SOLN
INTRAVENOUS | 0 refills | Status: DC
Start: 2017-05-05 — End: 2017-05-05
  Administered 2017-05-05: 19:00:00 4 mg via INTRAVENOUS

## 2017-05-05 NOTE — H&P (View-Only)
History and Physical Update Note    Name:  Pamela Kirk                          Medical Record Number:  [id number]     Allergies:  Codeine; Iodine; Other [unclassified drug]; and Latex, natural rubber  Primary Care Physician: Sidney Ace  Verified    Lab/Radiology/Other Diagnostic Tests:  Pertinent labs reviewed    Last Dose Beta Blockers/Anticoagulants:  N/A      Point of Care Testing:  (Last 24 hours):         I have examined the patient, and there are no significant changes in their condition, from the previous H&P performed on 05/05/17.    Karie Chimera, MD  Pager

## 2017-05-05 NOTE — Progress Notes
SPINE CENTER HISTORY AND PHYSICAL    No chief complaint on file.      Subjective     HISTORY:  [name] is a 47 year old female seen in the office today for her history and physical.  She is scheduled for surgery on May 05, 2017.  She had a traumatic event back on 11-17-16 in which she had a seizure and fell forwards and significantly aggravated her back and legs.  Prior to that she was not really having much in the way of back problems and really had no leg symptoms.  She does have a history of MS which was diagnosed when she was 69, however, that currently is in remission.  She was hospitalized at the time of this accident and was diagnosed with a herniated disc.  She saw Dr. Janyth Contes and underwent one epidural steroid injection.  She reports that that really did not help her very much.  She continues to have significant low back and left greater than right leg pain.  She reports the pain in her left leg radiates down the back of the left leg all the way to the bottom of her foot.  She certainly does have some symptoms on the right as well but not nearly as bad.  Her low back is probably equal to the left buttocks and left leg symptoms.     PHYSICAL EXAMINATION:  Limited range of motion, however, secondary to back pain.  Motor examination of the lower extremities is full to one time testing including EHL, anterior tib, quads and hip flexors.  Sensation diminished distally in the left leg, intact distally on the right. Reflexes are normal.  No mechanical hip pain to range of motion.  Negative straight leg raise on the right, significantly positive straight leg raise on the left reproducing left buttocks and thigh pain.  ???  X-RAYS:  X-rays of the lumbar spine shows normal alignment.  There is a little bit of asymmetrical collapse of the disc space at L4-5, which does cause a subtle curve but nothing significant.  Lateral view shows degeneration to the L4-5 and L5-S1 disc.  No anterior listhesis is noted. DIAGNOSTIC STUDIES:  MRI of the lumbar spine dated 12-20-16 confirms the presence of some moderate diffuse degenerative changes greatest at L4-5 and L5-S1.  Axial images show fairly large central to slightly left sided herniated nucleus pulposus at L5-S1.  No areas of nerve compression above that are noted.    ???  IMPRESSION: Left L5-S1 herniated nucleus pulposus.     PLAN:  I had a lengthy discussion with the patient about the problem and treatment alternatives.  I have again reviewed her diagnostic studies, as well as the pathophysiology and pathoanatomy of the problem.  We went over her options for treatment which include continued conservative measures such as activity modifications, anti-inflammatories, pain medicines and physical therapy vs. repeat injections vs. considering a surgery.  To consider surgery for this, really the only surgical alternative that we would have to offer her would be a left L5-S1 discectomy.  This would be to trim off that piece of disc tail that is irritating the left S1 nerve.  That would be most effective at helping the buttocks and left leg pain.  However, the fact that she really did not get much benefit from the epidural certainly does give [place] some concern that she would not do well following surgery.  It certainly would not treat her back pain which is at least 50% of  her problem and I think it potentially could make that worse.  She has pursued appropriate conservative management and the strongest option is surgery and surgery would entail a microdiscectomy.  I have discussed the risks and potential complications of surgery, including, but not limited to, anesthesia, bleeding, infection, neurovascular injury, spinal fluid leak, re-herniation, instability and persistent pain. We have also discussed the convalescence and recovery.  She elects to proceed with surgery and her informed consent was obtained today.  She was forwarded to the pre-assessment clinic for evaluation.  I have tried to answer all her questions at length.                 Past Medical History:   Diagnosis Date   ??? Graves disease    ??? Graves disease    ??? Hypertension    ??? Multiple sclerosis (HCC)    ??? Seizure (HCC) 10/2016    approx 2/month   ??? TIA (transient ischemic attack)     vs stroke       Past Surgical History:   Procedure Laterality Date   ??? TUBAL LIGATION  2001   ??? THYROIDECTOMY  11/2012       family history includes Cancer in her maternal grandfather, maternal grandmother, and paternal grandfather; Diabetes in her father; Hypertension in her father and mother.    Social History     Socioeconomic History   ??? Marital status: Married     Spouse name: Not on file   ??? Number of children: Not on file   ??? Years of education: Not on file   ??? Highest education level: Not on file   Social Needs   ??? Financial resource strain: Not on file   ??? Food insecurity - worry: Not on file   ??? Food insecurity - inability: Not on file   ??? Transportation needs - medical: Not on file   ??? Transportation needs - non-medical: Not on file   Occupational History   ??? Not on file   Tobacco Use   ??? Smoking status: Current Every Day Smoker     Packs/day: 1.00     Years: 25.00     Pack years: 25.00     Types: Cigarettes   ??? Smokeless tobacco: Current User   Substance and Sexual Activity   ??? Alcohol use: Yes     Comment: occ   ??? Drug use: No   ??? Sexual activity: Not on file   Other Topics Concern   ??? Not on file   Social History Narrative   ??? Not on file       Allergies   Allergen Reactions   ??? Codeine HIVES   ??? Iodine HIVES   ??? Other [Unclassified Drug] HIVES     Hair coloring agent, unknown which agent   ??? Latex, Natural Rubber RASH       No current facility-administered medications for this encounter.     Current Outpatient Medications:   ???  acetaminophen (TYLENOL) 325 mg tablet, Take two tablets by mouth every 12 hours as needed for Pain., Disp: , Rfl: ???  ALPRAZolam(+) (XANAX) 2 mg tablet, Take 2 mg by mouth at bedtime daily., Disp: , Rfl:   ???  baclofen (LIORESAL) 20 mg tablet, Take 20 mg by mouth three times daily as needed., Disp: , Rfl:   ???  camphor/menthol (SARNA) 0.5/0.5 % lotn, Apply  topically to affected area three times daily as needed., Disp: , Rfl:   ???  cholecalciferol (vitamin D3) 10,000 unit tab, Take 1 tablet by mouth twice weekly., Disp: , Rfl:   ???  clotrimazole (MYCELEX) 10 mg troche, Take 10 mg by mouth four times daily as needed. For 1 week as needed for Thrush, Disp: , Rfl:   ???  cyanocobalamin(DIL) (VITAMIN B-12, RUBRAMIN) 100 mcg/mL, Inject 100 mcg into the muscle every 7 days. Saturdays, Disp: , Rfl:   ???  dextroamphetamine-amphetamine (ADDERALL) 20 mg tablet, Take 20 mg by mouth twice daily, Disp: , Rfl:   ???  diclofenac submicronized (ZORVOLEX) 35 mg cap, Take 1 capsule by mouth three times daily., Disp: , Rfl:   ???  Folic Acid 800 mcg tab, Take 1 tablet by mouth daily., Disp: , Rfl:   ???  gabapentin (NEURONTIN) 300 mg capsule, Take 900 mg by mouth every 8 hours., Disp: , Rfl:   ???  HYDROcodone/acetaminophen(+) (NORCO) 10/325 mg tablet, Take 1 tablet by mouth twice daily, Disp: , Rfl:   ???  levothyroxine (SYNTHROID) 125 mcg tablet, Take 125 mcg by mouth daily 30 minutes before breakfast., Disp: , Rfl:   ???  lidocaine (LMX) 4 % crea, Apply  topically to affected area four times daily as needed., Disp: , Rfl:   ???  losartan(+) (COZAAR) 100 mg tablet, Take 100 mg by mouth at bedtime daily., Disp: , Rfl:   ???  magnesium chloride (MAG DELAY) 535 mg (64mg  elemental) tablet, Take 535 mg by mouth daily., Disp: , Rfl:   ???  metoprolol XL (TOPROL XL) 25 mg extended release tablet, Take 12.5 mg by mouth daily., Disp: , Rfl:   ???  other medication, Apply 1 Dose topically to affected area daily. CBD Oil Cream, Disp: , Rfl:   ???  rizatriptan (MAXALT) 5 mg tablet, 1 Tab as Needed. May repeat in 2 hours in needed  Indications: MIGRAINE, Disp: 12 Tab, Rfl: 5 ???  tiZANidine (ZANAFLEX) 4 mg tablet, Take 4 mg by mouth three times daily as needed., Disp: , Rfl:   ???  topiramate (TOPAMAX) 100 mg tablet, Take 100 mg by mouth at bedtime daily., Disp: , Rfl:   ???  traZODone (DESYREL) 100 mg tablet, Take 100 mg by mouth at bedtime as needed., Disp: , Rfl:     There were no vitals filed for this visit.              There is no height or weight on file to calculate BMI.    Review of Systems           In the presence of Dr. Jean Rosenthal, I am taking down these notes, D. Lynch, scribe. 04/23/17 @ 11:30 am

## 2017-05-05 NOTE — Progress Notes
RT Adult Assessment Note    NAME:Pamela Kirk             MRN: [id number]             DOB:May 17, 1970          AGE: 47 y.o.  ADMISSION DATE: 05/05/2017             DAYS ADMITTED: LOS: 0 days    RT Treatment Plan:  Protocol Plan: Medications  Albuterol: MDI PRN    Protocol Plan: Procedures  IPPB: Place a nursing order for "IS Q1h While Awake" for any of Lung Expansion indicators    Additional Comments:  Impressions of the patient: talking to family  Intervention(s)/outcome(s): assess patient  Patient education that was completed: MDI   Recommendations to the care team: none    Vital Signs:  Pulse: Pulse: 62  RR: Respirations: 16 PER MINUTE  SpO2: SpO2: 97 %  O2 Device:    Liter Flow:    O2%: O2 Percent: 21 %  Breath Sounds: All Breath Sounds: Clear (implies normal)  Respiratory Effort: Respiratory Effort: Non-Labored

## 2017-05-05 NOTE — Other
Brief Operative Note    Name: Pamela Kirk is a 47 y.o. female     DOB: 05-02-1970             MRN#: [id number]  DATE OF OPERATION: 05/05/2017    Date:  05/05/2017        Preoperative Dx:   Herniated nucleus pulposus, L5-S1, left [M51.27]    Post-op Diagnosis      * Herniated nucleus pulposus, L5-S1, left [M51.27]    Procedure(s):  LEFT LUMBAR 5 TO SACRAL 1 MICRODISCECTOMY    Anesthesia Type: General    Surgeon(s) and Role:     * Karie Chimera, MD - Primary     * Schatmeyer, Mayra Neer., MD - Resident - Assisting     * Priscille Loveless, MD - Resident - Assisting      Findings:  left L5-S1 disc herniation    Estimated Blood Loss: No blood loss documented.     Specimen(s) Removed/Disposition: * No specimens in log *    Complications:  None    Implants: None    Drains: None    Disposition:  PACU - stable    Priscille Loveless, MD  Pager [phone]

## 2017-05-05 NOTE — Progress Notes
Patient arrived to room # (4313-1) via bed accompanied by transport. Bedside safety checks completed. Initial patient assessment completed, refer to flowsheet for details. Admission skin assessment completed by: Domingo Sep, RN & Joylene Grapes, RN    Pressure Injury Present on Hospital Admission (within 24 hours): No    1. Occiput: No  2. Ear: No  3. Scapula: No  4. Spinous Process: No  5. Shoulder: No  6. Elbow: No  7. Iliac Crest: No  8. Sacrum/Coccyx: No  9. Ischial Tuberosity: No  10. Trochanter: No  11. Knee: No  12. Malleolus: No  13. Heel: No  14. Toes: No  15. Assessed for device associated injury Yes  16. Nursing Nutrition Assessment Completed Yes    See Doc Flowsheet for additional wound details.     INTERVENTIONS:

## 2017-05-05 NOTE — Anesthesia Post-Procedure Evaluation
Post-Anesthesia Evaluation    Name: Pamela Kirk      MRN: [id number]     DOB: 15-Jul-1970     Age: 47 y.o.     Sex: female   __________________________________________________________________________     Procedure Date: 05/05/2017  Procedure: Procedure(s) with comments:  LEFT LUMBAR 5 TO SACRAL 1 MICRODISCECTOMY - CASE LENGTH 1 HOUR, 4 POSTER FRAME, X-RAY, MICROSCOPE      Surgeon: Surgeon(s):  Karie Chimera, MD  Priscille Loveless, MD  Schatmeyer, Mayra Neer., MD    Post-Anesthesia Vitals  BP: 109/90 (01/08 1515)  Pulse: 61 (01/08 1530)  Respirations: 18 PER MINUTE (01/08 1530)  SpO2: 95 % (01/08 1530)  O2 Delivery: None (Room Air) (01/08 1530)  SpO2 Pulse: 61 (01/08 1530)      Post Anesthesia Evaluation Note    Evaluation location: pre/post  Patient participation: recovered; patient participated in evaluation  Level of consciousness: alert    Pain score: 6 (PCA in use controlling pain and patient ready to go home.)  Pain management: adequate    Hydration: normovolemia  Temperature: 36.0C - 38.4C  Airway patency: adequate    Perioperative Events  Perioperative events:  no       Post-op nausea and vomiting: no PONV    Postoperative Status  Cardiovascular status: hemodynamically stable  Respiratory status: spontaneous ventilation        Perioperative Events  Perioperative Event: No  Emergency Case Activation: No

## 2017-05-06 ENCOUNTER — Encounter: Admit: 2017-05-06 | Discharge: 2017-05-06

## 2017-05-06 ENCOUNTER — Encounter: Admit: 2017-05-05 | Discharge: 2017-05-05

## 2017-05-06 DIAGNOSIS — F17210 Nicotine dependence, cigarettes, uncomplicated: ICD-10-CM

## 2017-05-06 DIAGNOSIS — R569 Unspecified convulsions: ICD-10-CM

## 2017-05-06 DIAGNOSIS — I10 Essential (primary) hypertension: ICD-10-CM

## 2017-05-06 DIAGNOSIS — G35 Multiple sclerosis: ICD-10-CM

## 2017-05-06 DIAGNOSIS — Z8673 Personal history of transient ischemic attack (TIA), and cerebral infarction without residual deficits: ICD-10-CM

## 2017-05-06 DIAGNOSIS — G459 Transient cerebral ischemic attack, unspecified: ICD-10-CM

## 2017-05-06 DIAGNOSIS — M5127 Other intervertebral disc displacement, lumbosacral region: Principal | ICD-10-CM

## 2017-05-06 DIAGNOSIS — E0500 Thyrotoxicosis with diffuse goiter without thyrotoxic crisis or storm: Principal | ICD-10-CM

## 2017-05-06 LAB — CBC
Lab: 11 g/dL — ABNORMAL LOW (ref 12.0–15.0)
Lab: 13 % (ref 11–15)
Lab: 13 10*3/uL — ABNORMAL HIGH (ref 4.5–11.0)
Lab: 232 10*3/uL (ref 150–400)
Lab: 3.7 M/UL — ABNORMAL LOW (ref 4.0–5.0)
Lab: 31 pg (ref 26–34)
Lab: 34 % — ABNORMAL LOW (ref 36–45)
Lab: 34 g/dL (ref 32.0–36.0)
Lab: 9.1 FL (ref 7–11)
Lab: 93 FL (ref 80–100)

## 2017-05-06 LAB — BASIC METABOLIC PANEL: Lab: 138 MMOL/L — ABNORMAL HIGH (ref >60)

## 2017-05-06 MED ORDER — SENNOSIDES-DOCUSATE SODIUM 8.6-50 MG PO TAB
1 | ORAL_TABLET | Freq: Two times a day (BID) | ORAL | 1 refills | Status: AC | PRN
Start: 2017-05-06 — End: ?

## 2017-05-06 MED ORDER — OTHER MEDICATION
1 | Freq: Every day | TOPICAL | 0 refills | 57.00000 days | Status: AC
Start: 2017-05-06 — End: ?

## 2017-05-06 MED ORDER — OXYCODONE 5 MG PO TAB
5-15 mg | ORAL_TABLET | ORAL | 0 refills | 6.00000 days | Status: AC | PRN
Start: 2017-05-06 — End: 2019-01-12
  Filled 2017-05-06 (×2): qty 100, 9d supply, fill #1

## 2017-05-06 MED ORDER — LIDOCAINE 4 % TP CREA
Freq: Four times a day (QID) | TOPICAL | 0 refills | 30.00000 days | Status: AC | PRN
Start: 2017-05-06 — End: ?

## 2017-05-06 MED ORDER — OXYCODONE 5 MG PO TAB
5-15 mg | ORAL | 0 refills | Status: DC | PRN
Start: 2017-05-06 — End: 2017-05-06
  Administered 2017-05-06: 16:00:00 5 mg via ORAL
  Administered 2017-05-06: 15:00:00 10 mg via ORAL

## 2017-05-06 MED ORDER — CEPHALEXIN 500 MG PO CAP
500 mg | ORAL_CAPSULE | Freq: Four times a day (QID) | ORAL | 0 refills | Status: AC
Start: 2017-05-06 — End: ?
  Filled 2017-05-06 (×2): qty 20, 5d supply, fill #1

## 2017-05-06 MED ORDER — CAMPHOR-MENTHOL 0.5-0.5 % TP LOTN
Freq: Three times a day (TID) | TOPICAL | 0 refills | Status: AC | PRN
Start: 2017-05-06 — End: ?

## 2017-05-06 MED ORDER — ACETAMINOPHEN 325 MG PO TAB
650 mg | ORAL | 0 refills | Status: AC | PRN
Start: 2017-05-06 — End: ?

## 2017-05-06 NOTE — Progress Notes
Subjective: No acute events O/N.  Pain mostly controlled this morning. Tolerating diet. + Flatus, -BM. Leg pain prior to surgery is gone.       Objective:  Blood pressure 129/82, pulse 62, temperature 36.7 C (98.1 F), height 160 cm (62.99"), weight 73.8 kg (162 lb 9.6 oz), SpO2 99 %.     General: AAOx3, NAD  Cardiac: RRR  Respirations: Unlabored  Spine Exam: 5/5 ankle pf/df/EHL, SILT throughout, feet warm and well perfused, dressings with moderate serosanguinous drainage    No results for input(s): PTT, INR in the last 72 hours.     CBC w/Diff    Lab Results   Component Value Date/Time    WBC 13.9 (H) 05/06/2017 04:14 AM    HGB 11.8 (L) 05/06/2017 04:14 AM    HCT 34.6 (L) 05/06/2017 04:14 AM    PLTCT 232 05/06/2017 04:14 AM            Basic Metabolic Profile    Lab Results   Component Value Date/Time    NA 138 05/06/2017 04:14 AM    K 3.8 05/06/2017 04:14 AM    CL 111 (H) 05/06/2017 04:14 AM    CO2 21 05/06/2017 04:14 AM    GAP 6 05/06/2017 04:14 AM    Lab Results   Component Value Date/Time    BUN 8 05/06/2017 04:14 AM    CR 0.70 05/06/2017 04:14 AM    GLU 103 (H) 05/06/2017 04:14 AM        A/P: 47 y.o. F s/p Left L5/S1 microdiscectomy 1/8    -PT/OT, mobilize per spine protocol  -Transition to oral pain meds  -CLD, ADAT to regular  -Will change dressing today    Dispo: DC home today    Pamela Loveless, MD  Pager [phone]

## 2017-05-06 NOTE — Progress Notes
Pamela Kirk discharged on 05/06/2017.   Marland Kitchen  Discharge instructions reviewed with patient.  Valuables returned:   Personal Items / Valuables: Valuables/Belongings sent home with family/friends.  Home medications:    .  Functional assessment at discharge complete: Yes .

## 2017-05-06 NOTE — Progress Notes
PHYSICAL THERAPY  ASSESSMENT     MOBILITY:  Mobility  Progressive Mobility Level: Walk in hallway  Distance Walked (feet): 140 ft  Level of Assistance: Stand by assistance  Assistive Device: Walker  Time Tolerated: 11-30 minutes  Activity Limited By: Pain;Fatigue  SUBJECTIVE:  Subjective  Significant hospital events: s/p L5-S1 microdiscectomy (1/8), h/o MS, seizure  Mental / Cognitive Status: Alert;Oriented;Cooperative;Follows Commands; drowsy  Persons Present: Spouse  Pain: Patient complains of pain;7/10  Pain Location: Back;Post-surgical  Pain Interventions: Patient agrees to participate in therapy;Patient encouraged to use PCA/PNC (IV);Treatment altered to patient's pain tolerance  Precautions: Back Safety  Ambulation Assist: Independent Mobility at Household Level with Device  Patient Owned Equipment: Single Point [employer] Chair  Home Situation: Lives with Family(spouse)  Type of Home: House  Entry Stairs: 1-2 Stairs;No Rail  In-Home Stairs: No Stairs  Comments: Patient reports h/o falls, often while showering   STRENGTH:  Strength  Overall Strength: No Focal Deficits Noted  Strength Unable To Assess: Due to Pain/Surgery  POSTURE/NEURO:  Posture / Neurological  Overall Sensation/Proprioception: No Deficits Noted  BED MOBILITY/TRANSFERS:  Bed Mobility/Transfers  Bed Mobility: Rolling: Log Roll  Bed Mobility: Supine to Sit: Bed Flat;No Rail;Minimal Assist;Assist with Trunk  Transfer Type: Sit to/from Stand  Transfer: Assistance Level: To/From;Bed;Toilet;Standby Assist  Transfer: Assistive Device: Nurse, adult  Transfers: Type Of Assistance: Materials engineer;For Safety Considerations  End Of Activity Status: Up in Chair;Nursing Notified;Instructed Patient to Request Assist with Mobility  GAIT:  Gait  Gait Distance: 140 feet  Gait: Assistance Level: Standby Assist  Gait: Assistive Device: Nurse, adult  Gait: Descriptors: Pace: Slow;No balance loss;Decreased step length  Stairs: Number Climbed: 6 Stairs: Descriptors: Non-Reciprocal  Stairs: Assistance Level: Standby Assist  Stairs: Assistive Device: Two Rails  Comments: Patient will have family to assist as needed for home entry- no rails.   EDUCATION:  Education  Persons Educated: Patient/Family  Patient Barriers To Learning: None Noted  Teaching Methods: Verbal Instruction  Patient Response: Verbalized Understanding;Return Demonstration  Topics: Plan/Goals of PT Interventions;Use of Assistive Device/Orthosis;Mobility Progression;Precautions  ASSESSMENT/PROGRESS:  Assessment/Progress  Impaired Mobility Due To: Pain;Post Surgical Precautions;Medical Status Limitation  Assessment/Progress: Improving as Expected  AM-PAC 6 Clicks Basic Mobility Inpatient  Turning from your back to your side while in a flat bed without using bed rails: A Little  Moving from lying on your back to sitting on the side of a flatbed without using bedrails : A Little  Moving to and from a bed to a chair (including a wheelchair): None  Standing up from a chair using your arms (e.g. wheelchair, or bedside chair): None  To walk in hospital room: None  Climbing 3-5 steps with a railing: A Little  Raw Score: 21  Standardized (T-scale) Score: 45.55  Basic Mobility CMS 0-100%: 29.52  CMS G Code Modifier for Basic Mobility: CJ   Patient mobilizing with supervision primarily with the exception of bed mobility. She has supportive family at home.   PLAN:  Plan   Treatment Interventions: Mobility Training  Plan Frequency: No Further Treatment  PT Plan for Next Visit: Anticipate DC home today.  RECOMMENDATIONS:  PT Discharge Recommendations  PT Discharge Recommendations: Home with Assistance  Equipment Recommendations: Patient owns necessary equipment  Comments: Patient declines roller walker, prefers her 4 wheeled walker    Therapist: Janyce Llanos, PT  Date: 05/06/2017

## 2017-05-06 NOTE — Anesthesia Pain Rounding
Anesthesia Follow-Up Evaluation: Post-Procedure Day One    Name: Pamela Kirk     MRN: [id number]     DOB: [birth year]     Age: 47 y.o.     Sex: female   __________________________________________________________________________     Procedure Date: 05/05/2017   Procedure: Procedure(s) with comments:  LEFT LUMBAR 5 TO SACRAL 1 MICRODISCECTOMY - CASE LENGTH 1 HOUR, 4 POSTER FRAME, X-RAY, MICROSCOPE    Physical Assessment  Height: 160 cm (62.99)  Weight: 73.8 kg (162 lb 9.6 oz)    Vital Signs (Last Filed in 24 hours)  BP: 129/82 (01/09 0628)  Temp: 36.7 ???C (98.1 ???F) (01/09 8119)  Pulse: 62 (01/09 0628)  Respirations: 16 PER MINUTE (01/09 0628)  SpO2: 99 % (01/09 0628)  O2 Delivery: None (Room Air) (01/09 1478)  SpO2 Pulse: 61 (01/08 1530)  Height: 160 cm (62.99) (01/08 1135)    Patient History   Allergies  Allergies   Allergen Reactions   ??? Codeine HIVES   ??? Iodine HIVES   ??? Other [Unclassified Drug] HIVES     Hair coloring agent, unknown which agent   ??? Latex, Natural Rubber RASH        Medications  Scheduled Meds:    ALPRAZolam (XANAX) tablet 2 mg 2 mg Oral QHS   dextroamphetamine-amphetamine (ADDERALL) tablet 20 mg 20 mg Oral BID   docusate (COLACE) capsule 100 mg 100 mg Oral BID   gabapentin (NEURONTIN) capsule 900 mg 900 mg Oral Q8H*   levothyroxine (SYNTHROID) tablet 125 mcg 125 mcg Oral QDAY 30 min before breakfast   magnesium chloride (MAG DELAY) tablet 535 mg 535 mg Oral QDAY   metoprolol XL (TOPROL XL) tablet 12.5 mg 12.5 mg Oral QDAY   milk of magnesia (CONC) oral suspension 10 mL 10 mL Oral QDAY(21)   topiramate (TOPAMAX) tablet 100 mg 100 mg Oral QHS   Continuous Infusions:  ??? fentaNYL (SUBLIMAZE) PCA   550 mcg/ NS 55 mL infusion syr (std conc)(premade)     ??? sodium chloride 0.9 %   infusion 125 mL/hr at 05/06/17 0447     PRN and Respiratory Meds:acetaminophen Q4H PRN **OR** acetaminophen Q4H PRN, albuterol Q4H PRN, baclofen TID PRN, bisacodyl QDAY PRN, diphenhydrAMINE Q6H PRN **OR** diphenhydrAMINE Q6H PRN, naloxone PRN, ondansetron (ZOFRAN) IV Q6H PRN, oxyCODONE Q4H PRN, tiZANidine Q8H PRN, traZODone QHS PRN      Diagnostic Tests  Hematology:   Lab Results   Component Value Date    HGB 11.8 05/06/2017    HCT 34.6 05/06/2017    PLTCT 232 05/06/2017    WBC 13.9 05/06/2017    NEUT 47 12/19/2016    ANC 5.10 12/19/2016    ALC 4.60 12/19/2016    MONA 5 12/19/2016    AMC 0.50 12/19/2016    EOSA 2 12/19/2016    ABC 0.10 12/19/2016    MCV 93.6 05/06/2017    MCH 31.9 05/06/2017    MCHC 34.1 05/06/2017    MPV 9.1 05/06/2017    RDW 13.6 05/06/2017         General Chemistry:   Lab Results   Component Value Date    NA 138 05/06/2017    K 3.8 05/06/2017    CL 111 05/06/2017    CO2 21 05/06/2017    GAP 6 05/06/2017    BUN 8 05/06/2017    CR 0.70 05/06/2017    GLU 103 05/06/2017    CA 8.2 05/06/2017    ALBUMIN 4.0 04/23/2017  MG 2.3 12/19/2016    TOTBILI 0.2 04/23/2017      Coagulation: No results found for: PT, PTT, INR      Follow-Up Assessment  Patient location during evaluation: floor      Anesthetic Complications:   Anesthetic complications: The patient did not experience any anesthestic complications.      Pain:  Score: 4    Management:adequate (Fentanyl PCA)     Level of Consciousness: awake and alert   Hydration:acceptable     Airway Patency: patent   Respiratory Status: acceptable     Cardiovascular Status:acceptable   Regional/Neuroaxial:

## 2017-05-06 NOTE — Patient Education
This RN initiated education binder and left at patient's bedside for review with day RN. Pamela Kirk

## 2017-05-06 NOTE — Progress Notes
OCCUPATIONAL THERAPY  ASSESSMENT/DISCHARGE NOTE    Patient Name: Pamela Kirk                   Room/Bed: [id number]/04  Admitting Diagnosis:  Herniated nucleus pulposus, L5-S1, left [M51.27]    Mobility  Progressive Mobility Level: Walk in room  Distance Walked (feet): 20 ft  Level of Assistance: Stand by assistance  Assistive Device: Walker  Time Tolerated: 11-30 minutes  Activity Limited By: Pain;Fatigue    Subjective  Pertinent Dx per Physician: LEFT LUMBAR 5 TO SACRAL 1 MICRODISCECTOMY   Precautions: Standard;Back Precautions  Pain Location: Incisional;Back  Pain Level Current: 6 Severe pain  Comments: Pt seated in chair with call light in reach, needs addressed and nursing staff aware.    Objective  Psychosocial Status: Willing and Cooperative to Participate  Persons Present: Spouse    Home Living  Type of Home: House  Home Layout: Able to Live on Main Level w/Bedrm/Bathrm Access;Performs ADL'S on One Level  Financial risk analyst / Tub: Electrical engineer: Academic librarian Accessibility: Accessible via Landscape architect: Environmental consultant    Prior Function  Level Of Independence: Independent with homemaking w/ ambulation;Independent with ADLs and functional transfers  Lives With: Significant Other  Receives Help From: None Needed  Homemaking Tasks: Meal Prep;Laundry;Cleaning;Shopping(Pt's spouse plans to assist pt at discharge)  Other Function Comments: Pt will have assistance from spouse/family at discharge    Vision  Current Vision: No Visual Deficits    ADL's  Where Assessed: Standing at Sink;In [place];Chair  Equipment Provided: Long Handled Sponge  Eating Assist: Independent  Eating Deficits: Setup  Grooming Assist: Stand By Assist  Grooming Deficits: Wash/Dry Face;Wash/Dry Hands;Teeth Care;Brushing Hair;Verbal Cueing;Standing With Assistive Device  LE Dressing Assist: Stand By Assist  LE Dressing Deficits: Thread RLE Into Pants;Thread LLE Into Pants Toileting Assist: Stand By Assist  Toileting Deficits: Supervision/Safety  Functional Transfer Assist: Stand By Assist  Functional Transfer Deficits: Supervision/Safety;Therapist, nutritional)  Comment: OT reviewed back precautions and log roll techniques with pt, pt verbalizes and demonstrates understanding. Pt transferred to standing with walker, SBA. Pt completes functional mobility and ADL transfers with independence and while demonstrating safe compensatory techniques for ADL. Pt returns to chair with SBA, pt's needs addressed. Pt and pt's spouse report no further needs or concerns at this time. OT to sign off as no acute OT goals identified.    Activity Tolerance  Endurance: 3/5 Tolerates 25-30 Minutes Exercise w/Multiple Rests  Sitting Balance: 3+/5 Sits w/o UE Support for 30 Seconds or Greater    Cognition  Overall Cognitive Status: WFL to Adequately Complete Self Care Tasks Safely  Attention: Awake/Alert    UE PROM  Overall BUE PROM WNL: Yes    UE AROM  Overall BUE AROM WNL: Yes    Sensory  Overall Sensory: Pt Perceives Pressure in Both UEs in Gross Exam    UE Strength / Tone  Overall Strength / Tone: WFL Able to Perform ADL Tasks(within back precaution limitations)    Education  Persons Educated: Patient/Family  Barriers To Learning: None Noted  Teaching Methods: Verbal Instruction;Demonstration  Patient Response: Verbalized and Demo Understanding  Topics: Role of OT, Goals for Therapy;ADL Compensatory Techniques;Adaptive Devices for ADLs;DME for Home Discharge  Home Exercise Program: ADL Adaptive Equipment and Demonstration  Goal Formulation: With Patient;With Family    Assessment  Assessment: Decreased ADL Status;Decreased Endurance;Decreased Self-Care Trans;Decreased High-Level ADLs  Prognosis: Good;w/ Family  Goal  Formulation: Patient;Family  No Skilled OT: No Acute OT Goals Identified    AM-PAC 6 Clicks Daily Activity Inpatient  Putting on and taking off regular lower body clothes?: None Bathing (Including washing, rinsing, drying): None  Toileting, which includes using toilet, bedpan, or urinal: None  Putting on and taking off regular upper body clothing: None  Taking care of personal grooming such as brushing teeth: None  Eating meals?: None  Daily Activity Raw Score: 24  Standardized (t-scale) score: 57.54  CMS 0-100% Score: 0  CMS G Code Modifier: CH    Plan  Progress: Discontinue OT  OT Frequency: No Further Treatment    OT Discharge Recommendations  OT Discharge Recommendations: Home with family assist, No further OT goals identified at this time, Available help at home  Equipment Recommendations: Reacher    Therapist: Margaretha Glassing, OTR/L [id number]  Date: 05/06/2017

## 2017-05-07 ENCOUNTER — Encounter: Admit: 2017-05-07 | Discharge: 2017-05-07

## 2017-05-07 DIAGNOSIS — I10 Essential (primary) hypertension: ICD-10-CM

## 2017-05-07 DIAGNOSIS — G459 Transient cerebral ischemic attack, unspecified: ICD-10-CM

## 2017-05-07 DIAGNOSIS — E0500 Thyrotoxicosis with diffuse goiter without thyrotoxic crisis or storm: ICD-10-CM

## 2017-05-07 DIAGNOSIS — G35 Multiple sclerosis: ICD-10-CM

## 2017-05-07 DIAGNOSIS — R569 Unspecified convulsions: ICD-10-CM

## 2017-06-01 ENCOUNTER — Ambulatory Visit: Admit: 2017-06-01 | Discharge: 2017-06-02 | Payer: Commercial Managed Care - PPO

## 2017-06-01 ENCOUNTER — Encounter: Admit: 2017-06-01 | Discharge: 2017-06-01

## 2017-06-01 DIAGNOSIS — I10 Essential (primary) hypertension: ICD-10-CM

## 2017-06-01 DIAGNOSIS — G459 Transient cerebral ischemic attack, unspecified: ICD-10-CM

## 2017-06-01 DIAGNOSIS — G35 Multiple sclerosis: ICD-10-CM

## 2017-06-01 DIAGNOSIS — R569 Unspecified convulsions: ICD-10-CM

## 2017-06-01 DIAGNOSIS — E0500 Thyrotoxicosis with diffuse goiter without thyrotoxic crisis or storm: Principal | ICD-10-CM

## 2017-06-02 DIAGNOSIS — M5126 Other intervertebral disc displacement, lumbar region: Principal | ICD-10-CM

## 2017-06-29 ENCOUNTER — Encounter: Admit: 2017-06-29 | Discharge: 2017-06-29

## 2017-06-29 ENCOUNTER — Ambulatory Visit: Admit: 2017-06-29 | Discharge: 2017-06-30 | Payer: Commercial Managed Care - PPO

## 2017-06-29 DIAGNOSIS — R569 Unspecified convulsions: ICD-10-CM

## 2017-06-29 DIAGNOSIS — M2550 Pain in unspecified joint: ICD-10-CM

## 2017-06-29 DIAGNOSIS — F419 Anxiety disorder, unspecified: ICD-10-CM

## 2017-06-29 DIAGNOSIS — K5900 Constipation, unspecified: ICD-10-CM

## 2017-06-29 DIAGNOSIS — J189 Pneumonia, unspecified organism: ICD-10-CM

## 2017-06-29 DIAGNOSIS — G35 Multiple sclerosis: ICD-10-CM

## 2017-06-29 DIAGNOSIS — R51 Headache: ICD-10-CM

## 2017-06-29 DIAGNOSIS — M5136 Other intervertebral disc degeneration, lumbar region: ICD-10-CM

## 2017-06-29 DIAGNOSIS — G459 Transient cerebral ischemic attack, unspecified: ICD-10-CM

## 2017-06-29 DIAGNOSIS — R011 Cardiac murmur, unspecified: ICD-10-CM

## 2017-06-29 DIAGNOSIS — G40909 Epilepsy, unspecified, not intractable, without status epilepticus: ICD-10-CM

## 2017-06-29 DIAGNOSIS — M5030 Other cervical disc degeneration, unspecified cervical region: ICD-10-CM

## 2017-06-29 DIAGNOSIS — I10 Essential (primary) hypertension: ICD-10-CM

## 2017-06-29 DIAGNOSIS — E0500 Thyrotoxicosis with diffuse goiter without thyrotoxic crisis or storm: Principal | ICD-10-CM

## 2017-06-29 DIAGNOSIS — F329 Major depressive disorder, single episode, unspecified: ICD-10-CM

## 2017-06-29 DIAGNOSIS — M797 Fibromyalgia: ICD-10-CM

## 2017-06-30 DIAGNOSIS — Z9889 Other specified postprocedural states: ICD-10-CM

## 2017-06-30 DIAGNOSIS — M5126 Other intervertebral disc displacement, lumbar region: Principal | ICD-10-CM

## 2017-07-30 ENCOUNTER — Ambulatory Visit: Admit: 2017-07-30 | Discharge: 2017-07-31 | Payer: Commercial Managed Care - PPO

## 2017-07-30 ENCOUNTER — Encounter: Admit: 2017-07-30 | Discharge: 2017-07-30

## 2017-07-30 DIAGNOSIS — K5900 Constipation, unspecified: ICD-10-CM

## 2017-07-30 DIAGNOSIS — M797 Fibromyalgia: ICD-10-CM

## 2017-07-30 DIAGNOSIS — J189 Pneumonia, unspecified organism: ICD-10-CM

## 2017-07-30 DIAGNOSIS — R51 Headache: ICD-10-CM

## 2017-07-30 DIAGNOSIS — M5136 Other intervertebral disc degeneration, lumbar region: ICD-10-CM

## 2017-07-30 DIAGNOSIS — M5030 Other cervical disc degeneration, unspecified cervical region: ICD-10-CM

## 2017-07-30 DIAGNOSIS — R011 Cardiac murmur, unspecified: ICD-10-CM

## 2017-07-30 DIAGNOSIS — E0500 Thyrotoxicosis with diffuse goiter without thyrotoxic crisis or storm: Principal | ICD-10-CM

## 2017-07-30 DIAGNOSIS — R569 Unspecified convulsions: ICD-10-CM

## 2017-07-30 DIAGNOSIS — F419 Anxiety disorder, unspecified: ICD-10-CM

## 2017-07-30 DIAGNOSIS — I10 Essential (primary) hypertension: ICD-10-CM

## 2017-07-30 DIAGNOSIS — G459 Transient cerebral ischemic attack, unspecified: ICD-10-CM

## 2017-07-30 DIAGNOSIS — M2550 Pain in unspecified joint: ICD-10-CM

## 2017-07-30 DIAGNOSIS — G40909 Epilepsy, unspecified, not intractable, without status epilepticus: ICD-10-CM

## 2017-07-30 DIAGNOSIS — G35 Multiple sclerosis: ICD-10-CM

## 2017-07-30 DIAGNOSIS — F329 Major depressive disorder, single episode, unspecified: ICD-10-CM

## 2017-07-31 ENCOUNTER — Encounter: Admit: 2017-07-31 | Discharge: 2017-07-31

## 2017-07-31 DIAGNOSIS — Z006 Encounter for examination for normal comparison and control in clinical research program: Principal | ICD-10-CM

## 2017-08-05 ENCOUNTER — Encounter: Admit: 2017-08-05 | Discharge: 2017-08-05

## 2017-08-05 MED ORDER — TIZANIDINE 4 MG PO TAB
4 mg | ORAL_TABLET | Freq: Three times a day (TID) | ORAL | 3 refills | Status: AC
Start: 2017-08-05 — End: 2017-08-19

## 2017-08-19 ENCOUNTER — Encounter: Admit: 2017-08-19 | Discharge: 2017-08-19

## 2017-08-19 MED ORDER — TIZANIDINE 4 MG PO TAB
4 mg | ORAL_TABLET | Freq: Three times a day (TID) | ORAL | 3 refills | Status: AC
Start: 2017-08-19 — End: 2019-01-12

## 2018-09-02 ENCOUNTER — Encounter: Admit: 2018-09-02 | Discharge: 2018-09-02

## 2018-09-02 ENCOUNTER — Ambulatory Visit: Admit: 2018-09-02 | Discharge: 2018-09-03 | Payer: Commercial Managed Care - PPO

## 2018-09-02 DIAGNOSIS — Z006 Encounter for examination for normal comparison and control in clinical research program: Principal | ICD-10-CM

## 2018-09-03 DIAGNOSIS — Z006 Encounter for examination for normal comparison and control in clinical research program: Principal | ICD-10-CM

## 2018-09-10 ENCOUNTER — Encounter: Admit: 2018-09-10 | Discharge: 2018-09-10

## 2018-10-13 ENCOUNTER — Encounter: Admit: 2018-10-13 | Discharge: 2018-10-13

## 2018-10-14 NOTE — Telephone Encounter
[facility] records are located in [facility], clp

## 2018-11-08 NOTE — Telephone Encounter
11/08/18- Records Received from [facility] have been scanned to chart and available in the [facility] button.  Thanks,  [facility] Records Specialist

## 2018-11-17 ENCOUNTER — Encounter: Admit: 2018-11-17 | Discharge: 2018-11-17

## 2018-11-26 ENCOUNTER — Encounter: Admit: 2018-11-26 | Discharge: 2018-11-26

## 2018-11-26 DIAGNOSIS — M25571 Pain in right ankle and joints of right foot: Secondary | ICD-10-CM

## 2018-12-10 IMAGING — MR MRI LS-Spine, w/o
6 of 9 series · 21 of 48 positions shown · non-contrast
Comparison: none

EXAM:  MRI OF THE LUMBAR SPINE 
REASON FOR PROCEDURE:  A 46-year-old outpatient female presents for evaluation of lumbar back pain with radicular symptoms.

PROCEDURE:  MRI of the lumbar spine was performed according to protocol with T1 weighted sequences, T2 weighted sequences, proton density and STIR weighted sequences obtained in multiple planes through the lumbar spine using a 1.5 Tesla magnet.

[Series 101: survey 1 · axial · 10.0mm · 1.56mm/px · z∈[-3,+209]mm · 3 of 9 slices shown]
[im 1/9]
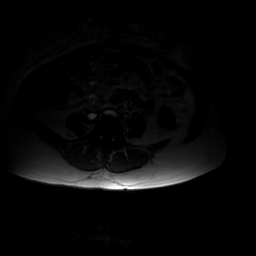
[im 5/9]
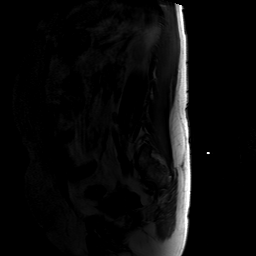
[im 9/9]
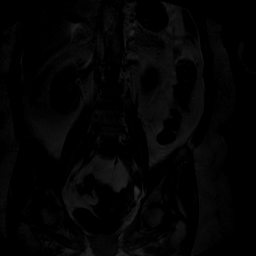

[Series 301: t2w_tse_cor · coronal · 5.0mm · 0.53mm/px · 4 of 15 slices shown]
[im 1/15]
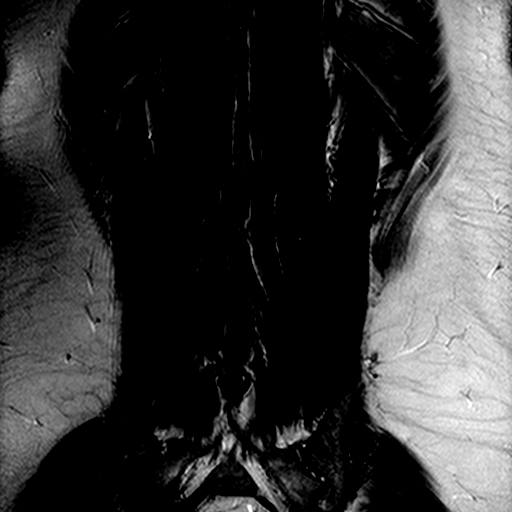
[im 5/15]
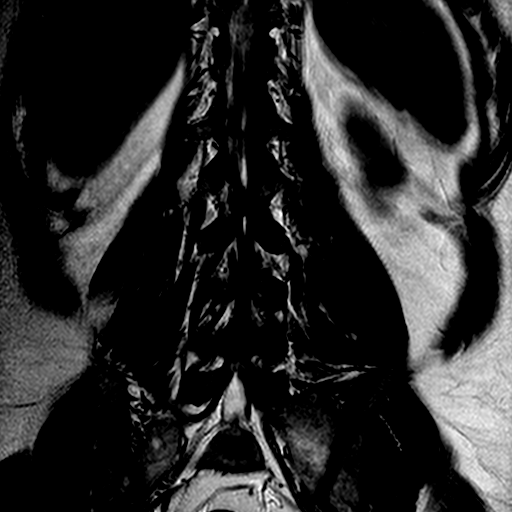
[im 10/15]
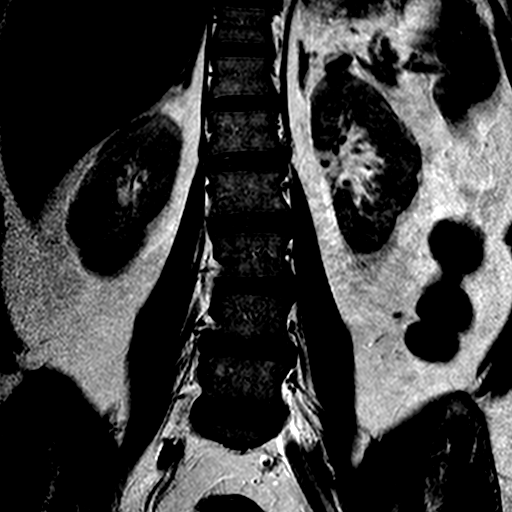
[im 15/15]
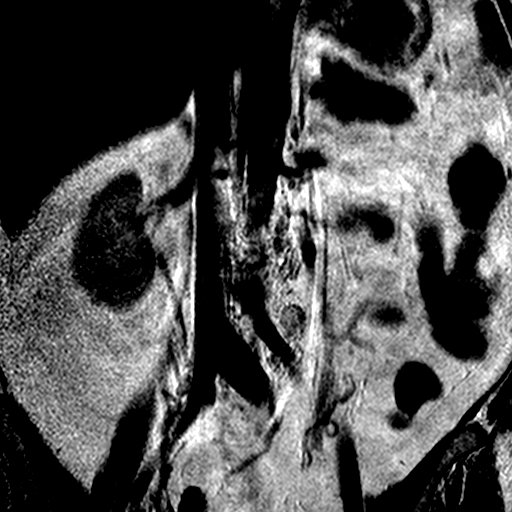

[Series 401: t2w_drive · sagittal · 4.0mm · 0.49mm/px · 4 of 15 slices shown]
[im 1/15]
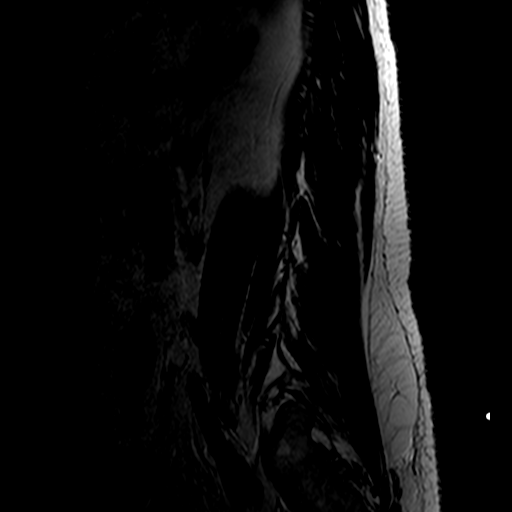
[im 5/15]
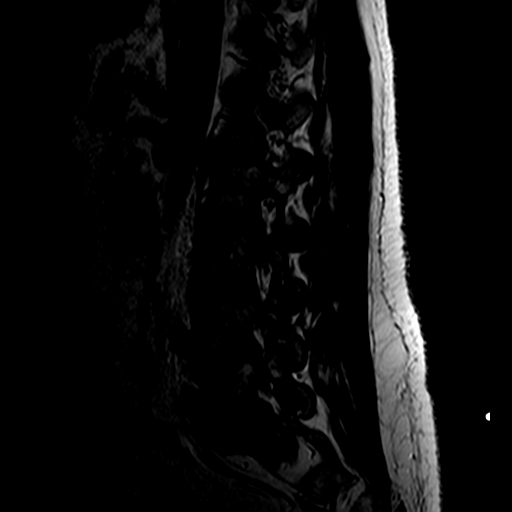
[im 10/15]
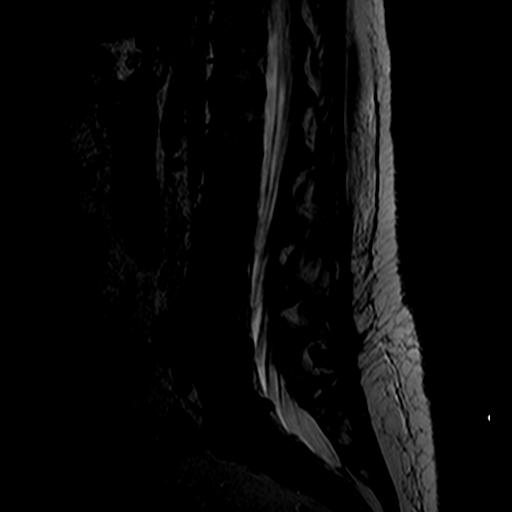
[im 15/15]
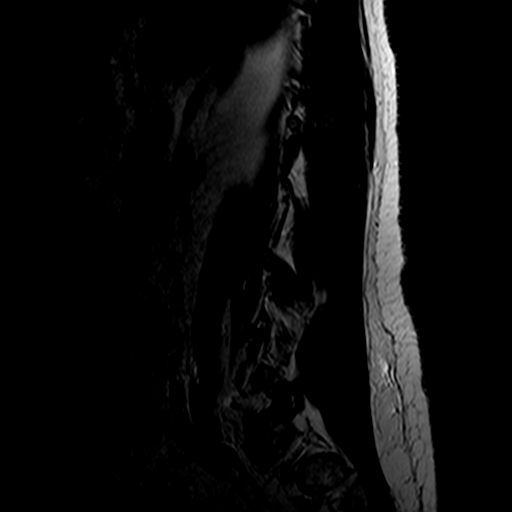

[Series 502: et1w_tse · sagittal · 4.0mm · 0.49mm/px · 4 of 15 slices shown]
[im 1/15]
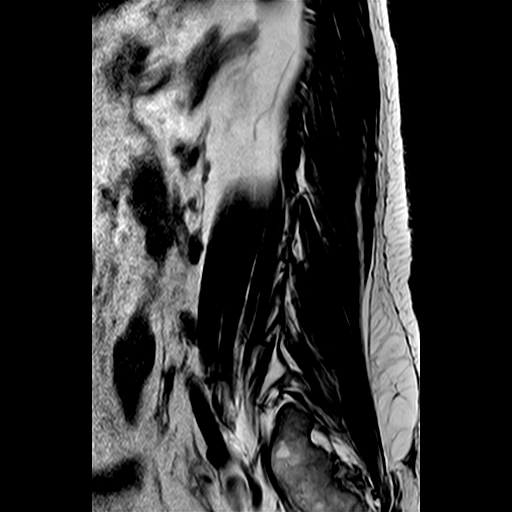
[im 5/15]
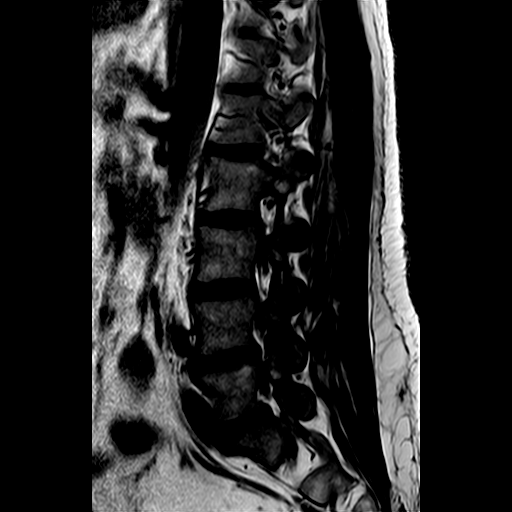
[im 10/15]
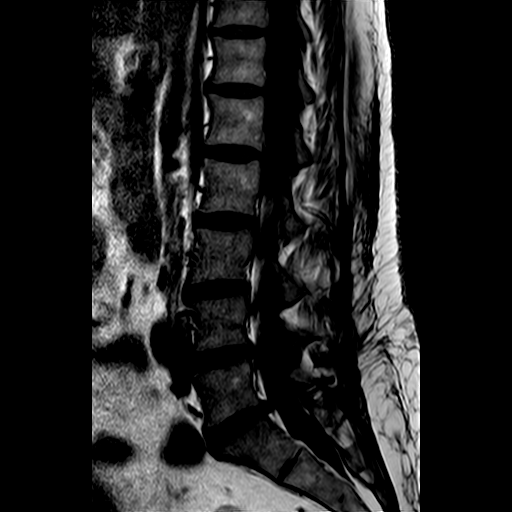
[im 15/15]
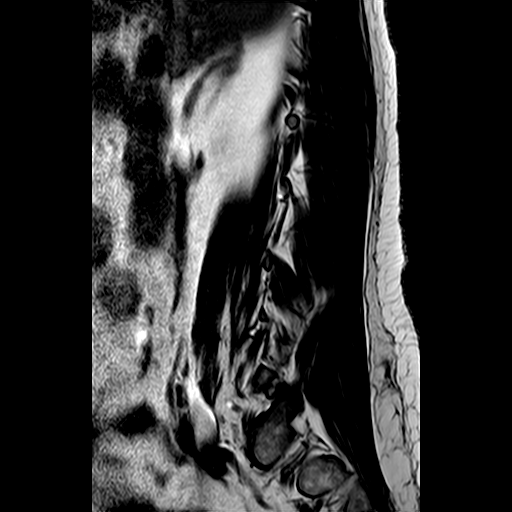

[Series 602: epdw_tse · sagittal · 4.0mm · 0.49mm/px · 4 of 15 slices shown]
[im 1/15]
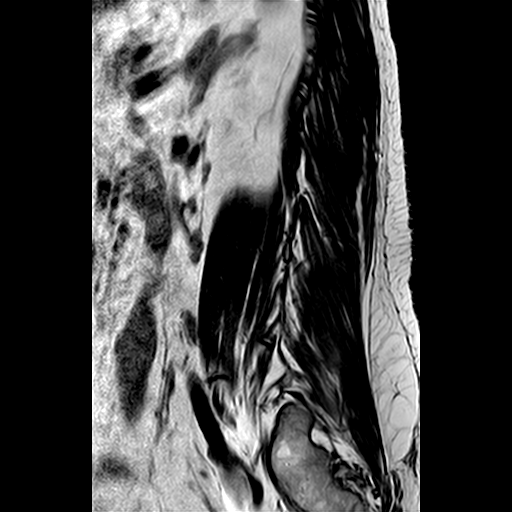
[im 5/15]
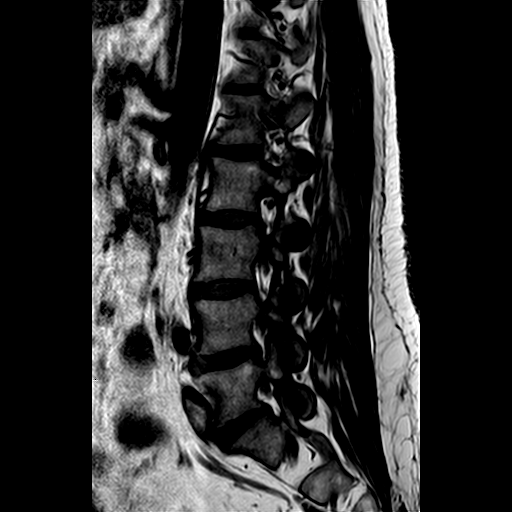
[im 10/15]
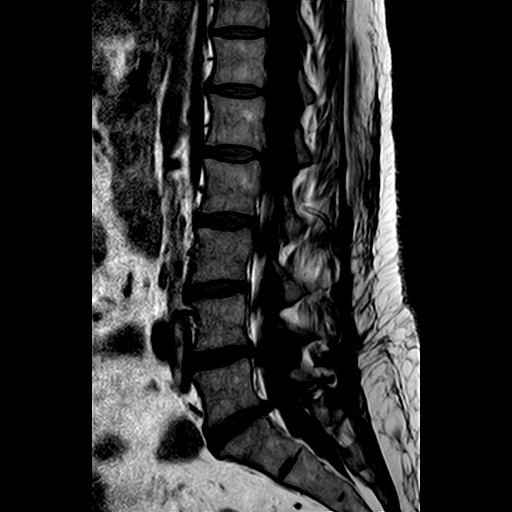
[im 15/15]
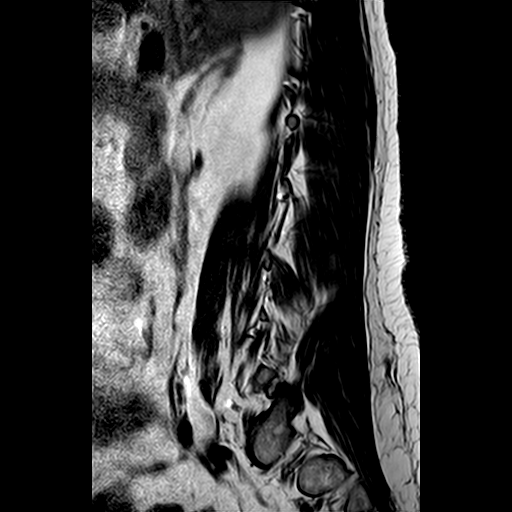

[Series 701: stir_opt. · sagittal · 4.0mm · 0.48mm/px · 2 of 15 slices shown]
[im 1/15]
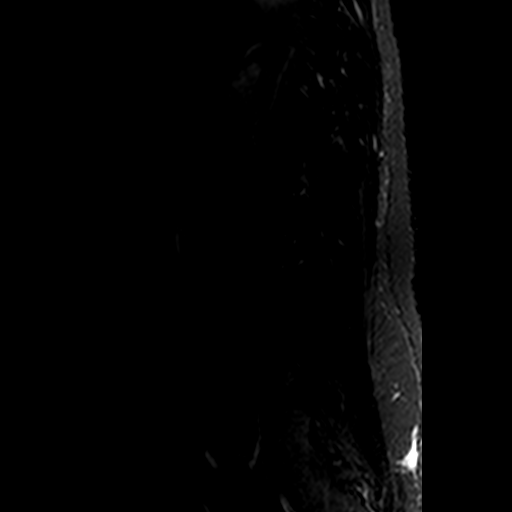
[im 5/15]
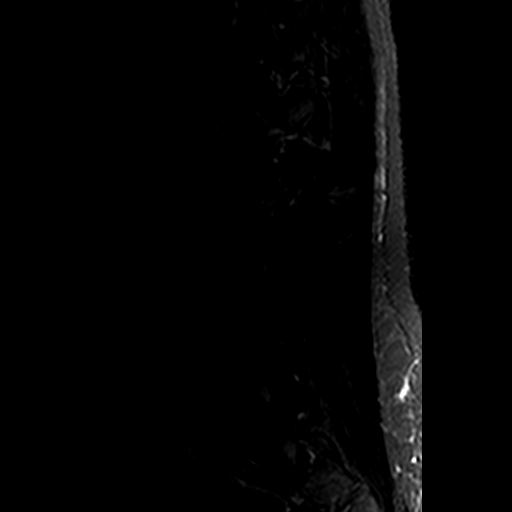

[21 of 48 positions shown; findings below may reference images not displayed]

FINDINGS: There is normal vertebral body height.
There is a mild levoconvex curvature to the lumbar spine.
There is disk space loss noted throughout the lumbar spine but worse at L4-5 and L5-S1.
There is a herniated disk at L5-S1.  
Distal cord ends at L1. 
The cauda equina is intact.

At L5-S1, there is a herniated disk which impinges the central left S1 nerve root.  I would recommend neurosurgical consultation and management.
There is left neural foraminal stenosis noted at this level.
The facets and posterior elements appear to be intact.

At L4-5, there is an annular bulging disk that produces left greater than right neural foraminal stenosis. 
There is mild hypertrophy of the ligamentum flavum at this level.

No other large bulging disks are appreciated.
There are normal vascular flow voids in the aorta and IVC.
There is normal marrow signal noted on all pulse sequences.
IMPRESSION: Large herniated nucleus pulposus at L5-S1 and impingement of the left S1 nerve root.  I would recommend neurosurgical consultation and management.

## 2019-01-12 ENCOUNTER — Encounter: Admit: 2019-01-12 | Discharge: 2019-01-12

## 2019-01-12 ENCOUNTER — Ambulatory Visit: Admit: 2019-01-12 | Discharge: 2019-01-13 | Payer: Commercial Managed Care - PPO

## 2019-01-12 NOTE — Patient Instructions
Please contact the cardiology [facility] Team if you have any questions or concerns. Call us at [phone] to talk to Jerrell Mangel or send an email through the MyChart system if you have non-urgent concerns. We will get back to you as soon as possible. For urgent or after hours needs, please call [phone].     NOTE: MyChart messages and phone calls received on weekends, on holidays, and after 4 pm on weekdays will NOT be seen until the following business day. If you have an urgent matter during these times, please call [phone] to reach the on-call team.     If you need prescription refills, please contact your pharmacy.

## 2019-08-30 ENCOUNTER — Encounter: Admit: 2019-08-30 | Discharge: 2019-08-30

## 2019-09-16 ENCOUNTER — Encounter: Admit: 2019-09-16 | Discharge: 2019-09-16

## 2019-09-16 ENCOUNTER — Ambulatory Visit: Admit: 2019-09-16 | Discharge: 2019-09-16 | Payer: Commercial Managed Care - PPO

## 2019-09-16 DIAGNOSIS — K5900 Constipation, unspecified: Secondary | ICD-10-CM

## 2019-09-16 DIAGNOSIS — G40909 Epilepsy, unspecified, not intractable, without status epilepticus: Secondary | ICD-10-CM

## 2019-09-16 DIAGNOSIS — F419 Anxiety disorder, unspecified: Secondary | ICD-10-CM

## 2019-09-16 DIAGNOSIS — E0500 Thyrotoxicosis with diffuse goiter without thyrotoxic crisis or storm: Secondary | ICD-10-CM

## 2019-09-16 DIAGNOSIS — R569 Unspecified convulsions: Secondary | ICD-10-CM

## 2019-09-16 DIAGNOSIS — G459 Transient cerebral ischemic attack, unspecified: Secondary | ICD-10-CM

## 2019-09-16 DIAGNOSIS — F329 Major depressive disorder, single episode, unspecified: Secondary | ICD-10-CM

## 2019-09-16 DIAGNOSIS — G35 Multiple sclerosis: Secondary | ICD-10-CM

## 2019-09-16 DIAGNOSIS — M5136 Other intervertebral disc degeneration, lumbar region: Secondary | ICD-10-CM

## 2019-09-16 DIAGNOSIS — R519 Generalized headaches: Secondary | ICD-10-CM

## 2019-09-16 DIAGNOSIS — J189 Pneumonia, unspecified organism: Secondary | ICD-10-CM

## 2019-09-16 DIAGNOSIS — M5030 Other cervical disc degeneration, unspecified cervical region: Secondary | ICD-10-CM

## 2019-09-16 DIAGNOSIS — R011 Cardiac murmur, unspecified: Secondary | ICD-10-CM

## 2019-09-16 DIAGNOSIS — I10 Essential (primary) hypertension: Secondary | ICD-10-CM

## 2019-09-16 DIAGNOSIS — M2550 Pain in unspecified joint: Secondary | ICD-10-CM

## 2019-09-16 DIAGNOSIS — M797 Fibromyalgia: Secondary | ICD-10-CM

## 2019-09-19 ENCOUNTER — Encounter: Admit: 2019-09-19 | Discharge: 2019-09-19

## 2019-09-19 DIAGNOSIS — R011 Cardiac murmur, unspecified: Secondary | ICD-10-CM

## 2019-09-19 DIAGNOSIS — K5900 Constipation, unspecified: Secondary | ICD-10-CM

## 2019-09-19 DIAGNOSIS — F329 Major depressive disorder, single episode, unspecified: Secondary | ICD-10-CM

## 2019-09-19 DIAGNOSIS — F419 Anxiety disorder, unspecified: Secondary | ICD-10-CM

## 2019-09-19 DIAGNOSIS — I10 Essential (primary) hypertension: Secondary | ICD-10-CM

## 2019-09-19 DIAGNOSIS — G35 Multiple sclerosis: Secondary | ICD-10-CM

## 2019-09-19 DIAGNOSIS — G40909 Epilepsy, unspecified, not intractable, without status epilepticus: Secondary | ICD-10-CM

## 2019-09-19 DIAGNOSIS — M5136 Other intervertebral disc degeneration, lumbar region: Secondary | ICD-10-CM

## 2019-09-19 DIAGNOSIS — R519 Generalized headaches: Secondary | ICD-10-CM

## 2019-09-19 DIAGNOSIS — R569 Unspecified convulsions: Secondary | ICD-10-CM

## 2019-09-19 DIAGNOSIS — J189 Pneumonia, unspecified organism: Secondary | ICD-10-CM

## 2019-09-19 DIAGNOSIS — M5030 Other cervical disc degeneration, unspecified cervical region: Secondary | ICD-10-CM

## 2019-09-19 DIAGNOSIS — E0500 Thyrotoxicosis with diffuse goiter without thyrotoxic crisis or storm: Secondary | ICD-10-CM

## 2019-09-19 DIAGNOSIS — G459 Transient cerebral ischemic attack, unspecified: Secondary | ICD-10-CM

## 2019-09-19 DIAGNOSIS — M797 Fibromyalgia: Secondary | ICD-10-CM

## 2019-09-19 DIAGNOSIS — M2550 Pain in unspecified joint: Secondary | ICD-10-CM

## 2019-09-30 ENCOUNTER — Encounter: Admit: 2019-09-30 | Discharge: 2019-09-30

## 2019-09-30 DIAGNOSIS — G35 Multiple sclerosis: Secondary | ICD-10-CM

## 2019-09-30 NOTE — Telephone Encounter
Order placed for MRI for patient.   1. Patient reports having increased dizziness on and off throughout the day, sometimes feeling like her body is spinning.   2. Reports she still hardly has any use of her right arm. Saw her PCP for routine follow up, who recommended possibly PT, or nerve conduction study. Patient is asking if she needs to wear a brace.

## 2019-10-04 ENCOUNTER — Encounter: Admit: 2019-10-04 | Discharge: 2019-10-04

## 2019-10-27 ENCOUNTER — Encounter: Admit: 2019-10-27 | Discharge: 2019-10-27

## 2020-03-28 ENCOUNTER — Encounter: Admit: 2020-03-28 | Discharge: 2020-03-28

## 2020-07-05 ENCOUNTER — Encounter: Admit: 2020-07-05 | Discharge: 2020-07-05

## 2020-07-05 DIAGNOSIS — G35 Multiple sclerosis: Secondary | ICD-10-CM

## 2020-07-05 NOTE — Telephone Encounter
Spoke to Dr. Burnadette Peter regarding patient. Per Dr. Burnadette Peter - have patient hold off on IV solumedrol and get MRI brain w/wo done, then follow up to discuss. Patient was informed of recommendations, she spoke to PCP Dr. Glennon Mac and declined to IV solumedrol. Patient asking for MRI order to be sent to PCP. Order faxed to [phone]. Did schedule patient with Dr. Burnadette Peter 07-26-20 at 2:20 pm. Advised patient to contact our office once MRI has been scheduled.

## 2020-07-05 NOTE — Telephone Encounter
Patient calls regarding symptoms- began 1-1.5 months ago and have progressively gotten worse. Saw PCP yesterday who believes symptoms are MS and wants to start patient o 5 days on IV solumedrol. Patient does not believe symptoms are MS related.   Symptoms she is having: stiffness in bilateral legs, causing her to shuffle when walking, balance concerns, legs have cold sensation, muscle fatigue, arms and legs are very sensitive to touch, intermittent swelling, increased fatigue, brain fog, trouble with words, possible syncope occurrence, right hand weakness- dropping things, severe dizziness, blurry & double vision ( did get glasses which helped for a short time, plans to contact Ophthalmology) Catching herself holding her breath when this happens has chest pain at top of rib cage.   Patient reports having COVID in January.

## 2020-07-25 ENCOUNTER — Encounter: Admit: 2020-07-25 | Discharge: 2020-07-25

## 2020-07-25 NOTE — Progress Notes
Called patient to see if MRI was completed locally, patient states PCP Dr. Dionne Ano is able to order and have it done locally. Asking for order to be faxed to him at [phone], order faxed office visit scheduled with Dr. Burnadette Peter tomorrow 07-26-20 cancelled, patient will notify us once MRI has been scheduled to ensure we request images and make follow up with Dr. Burnadette Peter

## 2020-07-31 ENCOUNTER — Encounter: Admit: 2020-07-31 | Discharge: 2020-07-31

## 2020-07-31 NOTE — Telephone Encounter
Called health help at [phone], a request was  Recently submitted then withdrawn, unable to complete online. CPT code 79024 for [facility]. It is in clinical review due to location. Faxed clinicals to [phone]     Case ID/Tracking [id number]

## 2020-08-02 ENCOUNTER — Encounter: Admit: 2020-08-02 | Discharge: 2020-08-02

## 2020-08-02 NOTE — Progress Notes
MRI approved April 5- Aug 30, 2020

## 2020-08-06 ENCOUNTER — Encounter: Admit: 2020-08-06 | Discharge: 2020-08-06

## 2020-08-06 NOTE — Progress Notes
Re-faxed prior authorization approve to [facility] at [phone]

## 2020-08-14 ENCOUNTER — Encounter: Admit: 2020-08-14 | Discharge: 2020-08-14

## 2021-02-26 ENCOUNTER — Encounter: Admit: 2021-02-26 | Discharge: 2021-02-26

## 2021-02-26 NOTE — Telephone Encounter
Patient reports symptoms beginning a month ago and getting worse. Having vision issues where she looses sight in left eye for a few minutes or up to 1-2 hours then vision return this has happened 3-4 times in the last month, also reports thought process is decreased, urinary incontinence, balance is off, dizziness, multiple falls, and increase in neuropathy - skin is ver sensitive to the touch in bottom of feet worse but reports from her knees down. Is currently taking gabapentin 900mg  every 8 hours. No recent illness. Is asking if she needs to be seen or Dr. recommendations until she can get in

## 2021-03-06 ENCOUNTER — Encounter: Admit: 2021-03-06 | Discharge: 2021-03-06

## 2021-03-12 ENCOUNTER — Encounter: Admit: 2021-03-12 | Discharge: 2021-03-12

## 2021-03-13 ENCOUNTER — Encounter: Admit: 2021-03-13 | Discharge: 2021-03-13

## 2021-03-13 ENCOUNTER — Ambulatory Visit: Admit: 2021-03-13 | Discharge: 2021-03-14 | Payer: Commercial Managed Care - PPO

## 2021-03-13 DIAGNOSIS — R569 Unspecified convulsions: Secondary | ICD-10-CM

## 2021-03-13 DIAGNOSIS — M5030 Other cervical disc degeneration, unspecified cervical region: Secondary | ICD-10-CM

## 2021-03-13 DIAGNOSIS — K5900 Constipation, unspecified: Secondary | ICD-10-CM

## 2021-03-13 DIAGNOSIS — M5136 Other intervertebral disc degeneration, lumbar region: Secondary | ICD-10-CM

## 2021-03-13 DIAGNOSIS — R519 Generalized headaches: Secondary | ICD-10-CM

## 2021-03-13 DIAGNOSIS — G40909 Epilepsy, unspecified, not intractable, without status epilepticus: Secondary | ICD-10-CM

## 2021-03-13 DIAGNOSIS — G35 Multiple sclerosis: Secondary | ICD-10-CM

## 2021-03-13 DIAGNOSIS — G43001 Migraine without aura, not intractable, with status migrainosus: Secondary | ICD-10-CM

## 2021-03-13 DIAGNOSIS — F419 Anxiety disorder, unspecified: Secondary | ICD-10-CM

## 2021-03-13 DIAGNOSIS — I10 Essential (primary) hypertension: Secondary | ICD-10-CM

## 2021-03-13 DIAGNOSIS — E0500 Thyrotoxicosis with diffuse goiter without thyrotoxic crisis or storm: Secondary | ICD-10-CM

## 2021-03-13 DIAGNOSIS — G459 Transient cerebral ischemic attack, unspecified: Secondary | ICD-10-CM

## 2021-03-13 DIAGNOSIS — R011 Cardiac murmur, unspecified: Secondary | ICD-10-CM

## 2021-03-13 DIAGNOSIS — F32A Depression: Secondary | ICD-10-CM

## 2021-03-13 DIAGNOSIS — J189 Pneumonia, unspecified organism: Secondary | ICD-10-CM

## 2021-03-13 DIAGNOSIS — M797 Fibromyalgia: Secondary | ICD-10-CM

## 2021-03-13 DIAGNOSIS — M2550 Pain in unspecified joint: Secondary | ICD-10-CM

## 2021-03-13 MED ORDER — TOPIRAMATE 25 MG PO TAB
ORAL_TABLET | 1 refills | Status: AC
Start: 2021-03-13 — End: ?
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
## 2021-10-15 ENCOUNTER — Encounter: Admit: 2021-10-15 | Discharge: 2021-10-15

## 2021-11-05 ENCOUNTER — Encounter: Admit: 2021-11-05 | Discharge: 2021-11-05

## 2021-11-06 ENCOUNTER — Encounter: Admit: 2021-11-06 | Discharge: 2021-11-06

## 2021-12-13 ENCOUNTER — Encounter: Admit: 2021-12-13 | Discharge: 2021-12-13

## 2021-12-16 ENCOUNTER — Encounter: Admit: 2021-12-16 | Discharge: 2021-12-16

## 2021-12-21 ENCOUNTER — Encounter: Admit: 2021-12-21 | Discharge: 2021-12-21 | Payer: Private Health Insurance - Indemnity

## 2022-01-01 ENCOUNTER — Encounter: Admit: 2022-01-01 | Discharge: 2022-01-01 | Payer: Private Health Insurance - Indemnity

## 2022-01-01 DIAGNOSIS — M5126 Other intervertebral disc displacement, lumbar region: Secondary | ICD-10-CM

## 2022-01-03 NOTE — Patient Instructions
It was nice to see you today.  Thank you for choosing to visit our clinic.  Your time is important, and if you had to wait today, we do apologize.  Our goal is to run exactly on time, however, on occasion, we get behind in clinic due to unexpected patient issues.  We appreciate your patience.     General Instructions:     Scheduling:  Our scheduling phone number is [phone].     How to reach our office:  Please send a MyChart message to the [facility] or leave a voicemail for the clinical nurse coordinator, Jet Traynham, RN, BSN at [phone]. Please note messages after 3 PM may not be answered until the next business day.     How to get a medication refill:  Please use the MyChart Refill request or contact your pharmacy directly to request medication refills.  Please allow 72 business hours for your request to be completed.        Support: for many chronic illnesses information is available through [facility] at [email or web address] or [phone].        For help with MyChart:  Please call [phone].        For questions on nights, weekends or holidays:  call the Operator at [phone], and ask for the doctor on call for Neurosurgery.        For more information on spinal conditions:  please visit [email or web address]       Our office fax number is: [phone]     Again, thank you for coming in today and allowing us to take part in your care.      Jerry Haugen, RN, BSN  Clinical Nurse Coordinator for Dr. Anthony Alvarado  [facility]  Marc A. Asher, MD, [facility]  [facility]  Phone [phone]  Fax [phone]

## 2022-01-07 ENCOUNTER — Encounter: Admit: 2022-01-07 | Discharge: 2022-01-07 | Payer: Private Health Insurance - Indemnity

## 2022-01-07 ENCOUNTER — Ambulatory Visit: Admit: 2022-01-07 | Discharge: 2022-01-07 | Payer: Private Health Insurance - Indemnity

## 2022-01-07 DIAGNOSIS — I8390 Asymptomatic varicose veins of unspecified lower extremity: Secondary | ICD-10-CM

## 2022-01-07 DIAGNOSIS — M5134 Other intervertebral disc degeneration, thoracic region: Secondary | ICD-10-CM

## 2022-01-07 DIAGNOSIS — R011 Cardiac murmur, unspecified: Secondary | ICD-10-CM

## 2022-01-07 DIAGNOSIS — M5126 Other intervertebral disc displacement, lumbar region: Secondary | ICD-10-CM

## 2022-01-07 DIAGNOSIS — M797 Fibromyalgia: Secondary | ICD-10-CM

## 2022-01-07 DIAGNOSIS — F419 Anxiety disorder, unspecified: Secondary | ICD-10-CM

## 2022-01-07 DIAGNOSIS — I10 Essential (primary) hypertension: Secondary | ICD-10-CM

## 2022-01-07 DIAGNOSIS — M47818 Spondylosis without myelopathy or radiculopathy, sacral and sacrococcygeal region: Secondary | ICD-10-CM

## 2022-01-07 DIAGNOSIS — G35 Multiple sclerosis: Secondary | ICD-10-CM

## 2022-01-07 DIAGNOSIS — E0500 Thyrotoxicosis with diffuse goiter without thyrotoxic crisis or storm: Secondary | ICD-10-CM

## 2022-01-07 DIAGNOSIS — K589 Irritable bowel syndrome without diarrhea: Secondary | ICD-10-CM

## 2022-01-07 DIAGNOSIS — J189 Pneumonia, unspecified organism: Secondary | ICD-10-CM

## 2022-01-07 DIAGNOSIS — M5136 Other intervertebral disc degeneration, lumbar region: Secondary | ICD-10-CM

## 2022-01-07 DIAGNOSIS — M5030 Other cervical disc degeneration, unspecified cervical region: Secondary | ICD-10-CM

## 2022-01-07 DIAGNOSIS — M5441 Lumbago with sciatica, right side: Secondary | ICD-10-CM

## 2022-01-07 DIAGNOSIS — R519 Generalized headaches: Secondary | ICD-10-CM

## 2022-01-07 DIAGNOSIS — G40909 Epilepsy, unspecified, not intractable, without status epilepticus: Secondary | ICD-10-CM

## 2022-01-07 DIAGNOSIS — I38 Endocarditis, valve unspecified: Secondary | ICD-10-CM

## 2022-01-07 DIAGNOSIS — R569 Unspecified convulsions: Secondary | ICD-10-CM

## 2022-01-07 DIAGNOSIS — F32A Depression: Secondary | ICD-10-CM

## 2022-01-07 DIAGNOSIS — K5900 Constipation, unspecified: Secondary | ICD-10-CM

## 2022-01-07 DIAGNOSIS — M2550 Pain in unspecified joint: Secondary | ICD-10-CM

## 2022-01-07 DIAGNOSIS — D539 Nutritional anemia, unspecified: Secondary | ICD-10-CM

## 2022-01-07 DIAGNOSIS — G459 Transient cerebral ischemic attack, unspecified: Secondary | ICD-10-CM

## 2022-01-07 NOTE — Progress Notes
Marc A. Clydene Pugh, MD [facility]    Subjective     REASON FOR VISIT   New Patient (Lumbar degen disc disease)    SUBJECTIVE     Pamela Kirk is a 51 year old female with a past medical history significant for hypertension, multiple sclerosis and epilepsy who presents for evaluation of chronic low back pain.  Patient has a spine history significant for left L5-S1 microdiscectomy in January 2019 with Dr. Jean Rosenthal.  Patient reports history of back pain for over 20+ years report after her back surgery she had improvement in her left leg symptoms but continued to have right leg symptoms.  Her right leg symptoms are nonprogressive, but rather chronic.  She describes low back pain that travels to the right buttocks and down the posterior thigh terminating above the knee.  She also reports bilateral hip pain. Her back pain is rated 10 out of 10 daily and drops to a 6/10 with Norco and ibuprofen.  She also takes gabapentin.  She reports chronic left leg numbness secondary to MS but no recent changes.  She denies bilateral lower extremity weakness.  She has had no physical therapy recently.  She has not had epidural steroid injections since prior to her last surgery.  She has been chronic bowel and bladder disturbances secondary to multiple sclerosis.  She can walk half a mile and is limited by back and hip pain.         ROS: Review of Systems   Constitutional: Negative.    HENT: Negative.    Respiratory: Negative.    Cardiovascular: Negative.    Gastrointestinal: Negative.    Genitourinary: Negative.    Musculoskeletal: Positive for arthralgias, back pain, myalgias and neck stiffness.   Skin: Negative.    Allergic/Immunologic: Negative.    Neurological: Positive for numbness.             Pain:  Where is your worst symptom?: Lower back  Words that best describe the quality of your symptom:: Aching, Numb-like, Stabbing, Tiring, Nagging, Shooting, Burning, Throbbing, Sharp  Are you having any pain?: Yes  When did your pain start?: Greater than 1 year  Have you been given a diagnosis for your pain?: Yes  Explanation of diagnosis:: Herniated disc/bulging disc  Is the pain constant or does it come and go?: Constant  Does the pain move into your arm or leg?: Yes  Explanation of how far pain moves:: I have numbness and weakness in my hands. Tremors, swelling, and jerking movements. Hard to hold onto paint brushes, pens and pencils or a cigarette. My hand jerks and whatever object I?m holding does flying.  Is this work related?: No  Was there a cause of injury (eg. fall, MVA, heavy lifting)?: No  What makes the symptom better? (eg. rest, sitting down, ice, heat, medications):: Other (comment)  Other reason that makes the pain better?: Laying down and medications  What makes the symptom worse? (eg. 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 Urgency and shy bladder  Is the pain worse at night?: No  Have you had any unplanned weight loss?: Yes  Pain Scale:  If you are here for pain. Where is the pain?: Low back  On a scale of 0 to 10, how bad is your low back pain?: 7  On a scale of 0 to 10, what was your average low back pain level in the past week?: 8         Tests for this condition:  Have you had any of the following tests for this condition?: MRI  What was the approximate date of MRI?: 11/06/21  List the facility where MRI took place:: [facility]    Treatment(s) tried for this condition:  What conservative therapies have you done for this condition?: Physical therapy, Injection  List the facility where you received physical therapy?: [facility]  What was the approximate date physical therapy was started?: 12/31/21  How long did you do physical therapy? (in weeks): 3  What was the approximate date of your last injection?: 12/31/21  What type of spine injection have you had in the past?: Epidural steroid injection  Have you had surgery relevant to pain?: Yes  What was the approximate date of your last surgery?: 05/13/18    Medications  Are you currently or in the past taken the following prescribed medications and found them effective in controlling your symptoms?: Baclofen (Gablofen), Gabapentin (Neurontin), Hydrocodone, Ibuprofen (Advil, Motrin), Naproxen (Aleve), Oxycodone (Oxycontin), Prednisone, Tompiramate (Topamax)  Are you currently or in the past taken the following prescribed medications and found them ineffective in controlling your symptoms?: Tizanadine (Zanaflex), Tompiramate (Topamax)    Blood Thinner  Are you taking one of the following blood thinners?: ibuprofen (Advil)     Medical History:   Diagnosis Date   ? Anxiety     Not sure of date.   ? Constipation     Always struggled bk and forth.   ? Degenerative disc disease, cervical     Last 10 yrs or so   ? Degenerative disc disease, lumbar     I was told, last 10 years   ? Degenerative disc disease, thoracic    ? Depression     Not sure of date.   ? Epilepsy [facility])     Not sure of date. Brought on by stress, migraine, anxiety.   ? Fibromyalgia     12 yrs   ? Generalized headaches     Early age? Grade school.   ? Graves disease    ? Graves disease    ? Heart murmur     Within the past year.   ? Heart valve disease    ? Hypertension    ? Irritable bowel disease     Microscopic Colitis   ? Joint pain     Not sure of date   ? Multiple sclerosis (HCC)    ? Pneumonia     Grade school   ? Seizure (HCC) 10/2016    approx 2/month   ? TIA (transient ischemic attack)     vs stroke   ? Unspecified deficiency anemia    ? Varicose veins     Possible mini stroke         Family History   Problem Relation Age of Onset   ?  Hypertension Mother    ? Arthritis Mother         RA, Back Surgery, Spinal Stenosis, fusion, degenerative disease, fibromyalgia and more   ? Back pain Mother    ? Joint Pain Mother    ? Neck Pain Mother    ? Neurologic Disorder Mother    ? Osteoporosis Mother    ? Diabetes Father         Diabetic, vision problems, mental health, +more   ? Hypertension Father    ? Heart problem Father    ? Joint Pain Father    ? Cancer Maternal Grandmother    ? Cancer Maternal Grandfather    ? Cancer Paternal Grandfather    ? Anesthetic Complication Sister    ? Arthritis Sister    ? Bleeding Disorders Sister         Clotting disorder   ? Joint Pain Sister    ? Arthritis Sister         She has lupus, spinal stenosis, degenerative disc, disease, etc.   ? Joint Pain Brother      Surgical History:   Procedure Laterality Date   ? TUBAL LIGATION  2001   ? THYROIDECTOMY  11/2012   ? LEFT LUMBAR 5 TO SACRAL 1 MICRODISCECTOMY N/A 05/05/2017    Performed by Karie Chimera, MD at [facility] OR   ? PR LAPAROSCOPY SURG RPR INITIAL INGUINAL HERNIA        Social History     Socioeconomic History   ? Marital status: Married   Tobacco Use   ? Smoking status: Every Day     Packs/day: 1.00     Years: 20.00     Additional pack years: 0.00     Total pack years: 20.00     Types: Cigarettes   ? Smokeless tobacco: Never   ? Tobacco comments:     I started smoking to fit in with the crowd. Now those people aren't even my friends anymore.   Substance and Sexual Activity   ? Alcohol use: Not Currently     Comment: 1/4 /mo   ? Drug use: Yes     Types: Marijuana, Other-See Comments     Comment: CBD WITH LEGAL AMOUNTS OF THC. I smoked weed to help w pain.   ? Sexual activity: Yes Partners: Male     Birth control/protection: None       Current Outpatient Medications:   ?  acetaminophen (TYLENOL) 325 mg tablet, Take two tablets by mouth every 4 hours as needed for Pain., Disp: , Rfl: 0  ?  ALPRAZolam(+) (XANAX) 2 mg tablet, Take one tablet by mouth at bedtime daily., Disp: , Rfl:   ?  baclofen (LIORESAL) 20 mg tablet, Take one tablet by mouth three times daily as needed. Indications: muscle spasms caused by a spinal disease, Disp: , Rfl:   ?  camphor/menthol (SARNA) 0.5/0.5 % lotn, Apply  topically to affected area three times daily as needed., Disp: , Rfl: 0  ?  cholecalciferol (vitamin D3) 10,000 unit tab, Take one tablet by mouth twice weekly., Disp: , Rfl:   ?  clotrimazole (MYCELEX) 10 mg troche, Take one Troche by mouth four times daily as needed. For 1 week as needed for Thrush, Disp: , Rfl:   ?  cyanocobalamin(DIL) (VITAMIN B-12, RUBRAMIN) 100 mcg/mL, Inject 1 mL into the muscle every 7 days. Saturdays, Disp: , Rfl:   ?  dextroamphetamine-amphetamine (ADDERALL) 20 mg tablet, Take 1.5 tablets by  mouth twice daily., Disp: , Rfl:   ?  Folic Acid 800 mcg tab, Take one tablet by mouth as Needed., Disp: , Rfl:   ?  furosemide (LASIX) 20 mg tablet, Take one tablet by mouth as Needed., Disp: , Rfl:   ?  gabapentin (NEURONTIN) 300 mg capsule, Take three capsules by mouth every 8 hours., Disp: , Rfl:   ?  HYDROcodone/acetaminophen (NORCO) 10/325 mg tablet, Take one tablet by mouth every 6 hours as needed for Pain., Disp: , Rfl:   ?  levothyroxine (SYNTHROID) 125 mcg tablet, Take one tablet by mouth daily 30 minutes before breakfast., Disp: , Rfl:   ?  lidocaine (LMX) 4 % crea, Apply  topically to affected area four times daily as needed., Disp: , Rfl: 0  ?  losartan(+) (COZAAR) 100 mg tablet, Take one tablet by mouth at bedtime daily., Disp: , Rfl:   ?  magnesium chloride (MAG DELAY) 535 mg (64mg  elemental) tablet, Take one tablet by mouth daily., Disp: , Rfl:   ?  metoprolol XL (TOPROL XL) 25 mg extended release tablet, Take one-half tablet by mouth daily., Disp: , Rfl:   ?  other medication, Apply one Dose topically to affected area daily. CBD Oil Cream. Do not apply on or near your surgical incision., Disp: , Rfl:   ?  rizatriptan (MAXALT) 5 mg tablet, 1 Tab as Needed. May repeat in 2 hours in needed  Indications: MIGRAINE, Disp: 12 Tab, Rfl: 5  ?  senna/docusate (SENOKOT-S) 8.6/50 mg tablet, Take one tablet by mouth twice daily as needed. Take for constipation while on narcotic pain medication. Hold for loose stools. (Patient taking differently: Take one tablet by mouth as Needed. Take for constipation while on narcotic pain medication. Hold for loose stools.), Disp: 30 tablet, Rfl: 1  ?  topiramate (TOPAMAX) 25 mg tablet, 3 qHS x 2 weeks,  Then 2 qHS x 2 weeks, then 1 qHS x 2 weeks, then DC, Disp: 90 tablet, Rfl: 1  ?  traZODone (DESYREL) 100 mg tablet, Take one tablet by mouth at bedtime as needed., Disp: , Rfl:       PHYSICAL EXAM   Blood pressure (!) 149/85, pulse 70, height 160 cm (5' 3), weight 74.8 kg (165 lb), SpO2 95 %.  Body mass index is 29.23 kg/m?Marland Kitchen  Oswestry Total Score:: 56  Pain Score: Six    PHYSICAL EXAM:  SKIN:  Normal. Two second capillary refill with no lesions, bruises noted.  EYES:  Examination shows no icterus, erthema, or evidence of infection. Fields are full to confrontation.  No dysconjugate gaze.  HENT:  Normal, without evidence of trauma or tenderness to palpation.  CARDIOVASCULAR:  Anterior tibial pulses present.  ABDOMEN:  Soft and non-tender.  MUSCULOSKELETAL:  Alignment - able to maintain horizontal gaze. Head aligned over pelvis. Knees extended. Coronally unremarkable. Shoulders level.    Bilateral sacroiliac joint tenderness to palpation.     NEUROLOGICAL:  Cognition  Oriented to person, place and time  Recent and remote memory intact.  Attention span normal  Fund of knowledge appropriate     Gait  Gait: Antalgic gait, normal stride      Coordination  Coordination intact in both upper extremities.     Appendicular  ROM: Decreased cervical and lumbar.  Muscle tone: Normal, no atrophy noticed.  Muscle strength:  Right                Left                                      Arm abduction             5/5                   5/5                                      Forearm flexion           5/5                   5/5                                      Forearm extension      5/5                   5/5                                      Wrist extension           5/5                   5/5                                      Finger extension         5/5                   5/5                                      Finger abduction         5/5                   5/5                                      Thumb pincer              5/5                   5/5                                      5th finger pincer          5/5                   5/5  Hip flexion                   5/5                   5/5                                      Leg extension             5/5                   5/5                                      Foot dorsiflexion         5/5                   5/5                                      1st toe extension         5/5                   5/5                                      Plantar flexion             5/5                   5/5     Sensation: Intact soft touch, pin prick and proprioception  Deep Tendon Reflexes                                                                             Right                Left                                      Biceps                         2/4                   2/4                                      Triceps                        2/4                   2/4  Brachiorad                   2/4                   2/4                                         Patellar 2/4                   2/4                                      Ankle                           2/4                   2/4                                           Pathologic reflexes or signs                                                                                        Right                Left                                         Ulnar Tinel                   absent             absent                                      Phalen                         absent             absent                                      Carpal Tinel                 absent             absent                                      Hoffmann  absent             absent                                      Plantar extension        absent             absent     Fine motor dexterity - direct and alternate finger tapping normal, R=L      RADIOGRAPHS   Lumbar radiographs reviewed.  Demonstrate preserved lumbar lordosis without abnormal motion on flexion-extension views.  EOS films reviewed.      EOS measurements     Full-length spine radiographs are evaluated:     PI                     58deg  PT                    21deg  SS                   41deg  LL                    56deg  TK 5-12           37deg  SVA   31mm     C7-CSVL         0 mm L    MRI lumbar spine from June 2023 reviewed.  Demonstrate preserved normal alignment.  There is a prior laminotomy at L5-S1.  There is mild disc bulge at L4-L5 with mild central canal stenosis and lateral recess stenosis.  L5-S1 there is loss of disc base height with broad-based disc protrusion causing mild bilateral recess stenosis.     ASSESSMENT / PLAN     Pamela Kirk is a 51 y.o. female with a history of L5-S1 left microdiscectomy in 2019.  She presents with chronic low back pain and right lower extremity intermittent numbness in an S1 pattern.  On physical examination she has no neurologic deficits.  She demonstrates exquisite bilateral SI joint tenderness to palpation. I had a thorough discussion regarding the clinical exam findings and imaging findings with the patient and her husband.  I discussed degenerative disease of her lumbar spine focus at L4-L5 and L5-S1 with the radiographic findings.  I have also discussed the bilateral SI joint tenderness on examination that appears to contribute to her symptomatology.  She has not had formal physical therapy or pain management intervention since her last surgery.  I have provided a prescription for physical therapy and recommended bilateral SI joint injections as initial treatment option to provide symptom relief.  The patient is in agreement this plan.  I offer the patient follow-up with Dr. Jean Rosenthal as they have an established relationship.  She will follow-up on an as-needed basis.    1. Chronic midline low back pain with right-sided sciatica        2. SI joint arthritis                   Janyce Llanos MD  Neurosurgery  Vernia Buff A. Clydene Pugh MD, [facility]  Nurse: Evette Cristal BSN, RN  Ph: [phone] - Fax: [phone]

## 2022-01-20 ENCOUNTER — Encounter: Admit: 2022-01-20 | Discharge: 2022-01-20 | Payer: Private Health Insurance - Indemnity

## 2023-06-04 ENCOUNTER — Encounter: Admit: 2023-06-04 | Discharge: 2023-06-04 | Payer: Private Health Insurance - Indemnity

## 2024-02-22 ENCOUNTER — Encounter: Admit: 2024-02-22 | Discharge: 2024-02-22 | Payer: PRIVATE HEALTH INSURANCE
